# Patient Record
Sex: Female | Born: 1965 | Race: Black or African American | Hispanic: No | Smoking: Current every day smoker
Health system: Southern US, Community
[De-identification: ages and names within clinical notes are randomized; demographics above are authoritative.]

## PROBLEM LIST (undated history)

## (undated) DIAGNOSIS — E669 Obesity, unspecified: Secondary | ICD-10-CM

## (undated) DIAGNOSIS — I10 Essential (primary) hypertension: Secondary | ICD-10-CM

## (undated) DIAGNOSIS — M797 Fibromyalgia: Secondary | ICD-10-CM

## (undated) HISTORY — PX: UTERINE FIBROID SURGERY: SHX826

## (undated) HISTORY — PX: ABDOMINAL SURGERY: SHX537

---

## 1998-08-16 ENCOUNTER — Emergency Department (HOSPITAL_COMMUNITY): Admission: EM | Admit: 1998-08-16 | Discharge: 1998-08-16 | Payer: Self-pay | Admitting: Emergency Medicine

## 1999-04-29 ENCOUNTER — Emergency Department (HOSPITAL_COMMUNITY): Admission: EM | Admit: 1999-04-29 | Discharge: 1999-04-29 | Payer: Self-pay | Admitting: Emergency Medicine

## 1999-05-08 ENCOUNTER — Emergency Department (HOSPITAL_COMMUNITY): Admission: EM | Admit: 1999-05-08 | Discharge: 1999-05-08 | Payer: Self-pay | Admitting: Emergency Medicine

## 2000-06-03 ENCOUNTER — Emergency Department (HOSPITAL_COMMUNITY): Admission: EM | Admit: 2000-06-03 | Discharge: 2000-06-03 | Payer: Self-pay | Admitting: Emergency Medicine

## 2001-03-17 ENCOUNTER — Other Ambulatory Visit: Admission: RE | Admit: 2001-03-17 | Discharge: 2001-03-17 | Payer: Self-pay | Admitting: Ophthalmology

## 2003-01-07 ENCOUNTER — Emergency Department (HOSPITAL_COMMUNITY): Admission: EM | Admit: 2003-01-07 | Discharge: 2003-01-07 | Payer: Self-pay | Admitting: Emergency Medicine

## 2003-04-13 ENCOUNTER — Other Ambulatory Visit: Admission: RE | Admit: 2003-04-13 | Discharge: 2003-04-13 | Payer: Self-pay | Admitting: Family Medicine

## 2004-10-11 ENCOUNTER — Other Ambulatory Visit: Admission: RE | Admit: 2004-10-11 | Discharge: 2004-10-11 | Payer: Self-pay | Admitting: Family Medicine

## 2005-05-30 ENCOUNTER — Emergency Department (HOSPITAL_COMMUNITY): Admission: EM | Admit: 2005-05-30 | Discharge: 2005-05-30 | Payer: Self-pay | Admitting: Emergency Medicine

## 2006-01-02 ENCOUNTER — Emergency Department (HOSPITAL_COMMUNITY): Admission: EM | Admit: 2006-01-02 | Discharge: 2006-01-03 | Payer: Self-pay | Admitting: Emergency Medicine

## 2006-11-01 ENCOUNTER — Emergency Department (HOSPITAL_COMMUNITY): Admission: EM | Admit: 2006-11-01 | Discharge: 2006-11-01 | Payer: Self-pay | Admitting: Emergency Medicine

## 2006-12-24 ENCOUNTER — Emergency Department (HOSPITAL_COMMUNITY): Admission: EM | Admit: 2006-12-24 | Discharge: 2006-12-25 | Payer: Self-pay | Admitting: Emergency Medicine

## 2007-03-02 ENCOUNTER — Other Ambulatory Visit: Admission: RE | Admit: 2007-03-02 | Discharge: 2007-03-02 | Payer: Self-pay | Admitting: Family Medicine

## 2007-10-16 ENCOUNTER — Emergency Department (HOSPITAL_COMMUNITY): Admission: EM | Admit: 2007-10-16 | Discharge: 2007-10-16 | Payer: Self-pay | Admitting: Emergency Medicine

## 2008-04-19 ENCOUNTER — Other Ambulatory Visit: Admission: RE | Admit: 2008-04-19 | Discharge: 2008-04-19 | Payer: Self-pay | Admitting: Family Medicine

## 2008-07-09 ENCOUNTER — Emergency Department (HOSPITAL_COMMUNITY): Admission: EM | Admit: 2008-07-09 | Discharge: 2008-07-10 | Payer: Self-pay | Admitting: Emergency Medicine

## 2008-10-09 ENCOUNTER — Emergency Department (HOSPITAL_COMMUNITY): Admission: EM | Admit: 2008-10-09 | Discharge: 2008-10-09 | Payer: Self-pay | Admitting: Emergency Medicine

## 2009-01-25 ENCOUNTER — Ambulatory Visit: Payer: Self-pay | Admitting: Gynecology

## 2009-02-08 ENCOUNTER — Ambulatory Visit: Payer: Self-pay | Admitting: Gynecology

## 2010-04-26 LAB — GLUCOSE, CAPILLARY: Glucose-Capillary: 93 mg/dL (ref 70–99)

## 2010-04-29 LAB — POCT CARDIAC MARKERS: Myoglobin, poc: 38.4 ng/mL (ref 12–200)

## 2010-07-18 ENCOUNTER — Other Ambulatory Visit: Payer: Self-pay | Admitting: Obstetrics and Gynecology

## 2010-07-18 DIAGNOSIS — N9489 Other specified conditions associated with female genital organs and menstrual cycle: Secondary | ICD-10-CM

## 2010-07-26 ENCOUNTER — Emergency Department (HOSPITAL_COMMUNITY): Payer: BC Managed Care – PPO

## 2010-07-26 ENCOUNTER — Emergency Department (HOSPITAL_COMMUNITY)
Admission: EM | Admit: 2010-07-26 | Discharge: 2010-07-26 | Disposition: A | Discharge status: Home / Self Care | Payer: BC Managed Care – PPO | Attending: Emergency Medicine | Admitting: Emergency Medicine

## 2010-07-26 DIAGNOSIS — D259 Leiomyoma of uterus, unspecified: Secondary | ICD-10-CM | POA: Insufficient documentation

## 2010-07-26 DIAGNOSIS — N949 Unspecified condition associated with female genital organs and menstrual cycle: Secondary | ICD-10-CM | POA: Insufficient documentation

## 2010-07-26 DIAGNOSIS — R109 Unspecified abdominal pain: Secondary | ICD-10-CM | POA: Insufficient documentation

## 2010-07-26 LAB — COMPREHENSIVE METABOLIC PANEL
ALT: 14 U/L (ref 0–35)
BUN: 8 mg/dL (ref 6–23)
CO2: 25 mEq/L (ref 19–32)
Calcium: 9.3 mg/dL (ref 8.4–10.5)
Chloride: 105 mEq/L (ref 96–112)
Creatinine, Ser: 0.63 mg/dL (ref 0.50–1.10)
Potassium: 3.7 mEq/L (ref 3.5–5.1)
Sodium: 139 mEq/L (ref 135–145)
Total Bilirubin: 0.4 mg/dL (ref 0.3–1.2)

## 2010-07-26 LAB — URINALYSIS, ROUTINE W REFLEX MICROSCOPIC
Bilirubin Urine: NEGATIVE
Protein, ur: NEGATIVE mg/dL

## 2010-07-26 LAB — CBC
MCV: 76.8 fL — ABNORMAL LOW (ref 78.0–100.0)
Platelets: 267 10*3/uL (ref 150–400)
RBC: 5.04 MIL/uL (ref 3.87–5.11)
WBC: 8.3 10*3/uL (ref 4.0–10.5)

## 2010-07-26 LAB — DIFFERENTIAL
Basophils Relative: 0 % (ref 0–1)
Eosinophils Absolute: 0 10*3/uL (ref 0.0–0.7)
Lymphocytes Relative: 13 % (ref 12–46)
Monocytes Absolute: 0.6 10*3/uL (ref 0.1–1.0)
Monocytes Relative: 8 % (ref 3–12)
Neutro Abs: 6.5 10*3/uL (ref 1.7–7.7)
Neutrophils Relative %: 79 % — ABNORMAL HIGH (ref 43–77)

## 2010-07-27 ENCOUNTER — Ambulatory Visit
Admission: RE | Admit: 2010-07-27 | Discharge: 2010-07-27 | Disposition: A | Discharge status: Home / Self Care | Payer: BC Managed Care – PPO | Source: Ambulatory Visit | Attending: Obstetrics and Gynecology | Admitting: Obstetrics and Gynecology

## 2010-07-27 DIAGNOSIS — N9489 Other specified conditions associated with female genital organs and menstrual cycle: Secondary | ICD-10-CM

## 2010-07-27 MED ORDER — GADOBENATE DIMEGLUMINE 529 MG/ML IV SOLN
20.0000 mL | Freq: Once | INTRAVENOUS | Status: AC | PRN
  Administered 2010-07-27: 20 mL via INTRAVENOUS

## 2010-10-28 LAB — I-STAT 8, (EC8 V) (CONVERTED LAB)
BUN: 7
Bicarbonate: 27.3 — ABNORMAL HIGH
Chloride: 106
Glucose, Bld: 87
Hemoglobin: 13.9
Sodium: 141
TCO2: 29
pCO2, Ven: 48.2

## 2010-10-28 LAB — CBC
Hemoglobin: 11.5 — ABNORMAL LOW
Platelets: 399
RDW: 14.4

## 2010-10-28 LAB — DIFFERENTIAL
Basophils Relative: 0
Eosinophils Absolute: 0.4
Eosinophils Relative: 7 — ABNORMAL HIGH
Lymphocytes Relative: 26
Monocytes Relative: 10
Neutro Abs: 3.8

## 2010-10-28 LAB — POCT I-STAT CREATININE: Operator id: 284251

## 2011-06-01 ENCOUNTER — Encounter (HOSPITAL_COMMUNITY): Payer: Self-pay | Admitting: Emergency Medicine

## 2011-06-01 ENCOUNTER — Emergency Department (HOSPITAL_COMMUNITY)
Admission: EM | Admit: 2011-06-01 | Discharge: 2011-06-01 | Disposition: A | Discharge status: Home / Self Care | Payer: BC Managed Care – PPO | Attending: Emergency Medicine | Admitting: Emergency Medicine

## 2011-06-01 DIAGNOSIS — M549 Dorsalgia, unspecified: Secondary | ICD-10-CM

## 2011-06-01 DIAGNOSIS — F17200 Nicotine dependence, unspecified, uncomplicated: Secondary | ICD-10-CM | POA: Insufficient documentation

## 2011-06-01 DIAGNOSIS — M545 Low back pain, unspecified: Secondary | ICD-10-CM | POA: Insufficient documentation

## 2011-06-01 DIAGNOSIS — M62838 Other muscle spasm: Secondary | ICD-10-CM | POA: Insufficient documentation

## 2011-06-01 LAB — URINALYSIS, ROUTINE W REFLEX MICROSCOPIC
Bilirubin Urine: NEGATIVE
Glucose, UA: NEGATIVE mg/dL
Hgb urine dipstick: NEGATIVE
Specific Gravity, Urine: 1.022 (ref 1.005–1.030)
Urobilinogen, UA: 1 mg/dL (ref 0.0–1.0)
pH: 6.5 (ref 5.0–8.0)

## 2011-06-01 LAB — URINE MICROSCOPIC-ADD ON

## 2011-06-01 MED ORDER — DIAZEPAM 5 MG PO TABS: ORAL_TABLET | ORAL | Status: AC

## 2011-06-01 MED ORDER — IBUPROFEN 800 MG PO TABS: 800.0000 mg | ORAL_TABLET | Freq: Three times a day (TID) | ORAL | Status: AC

## 2011-06-01 MED ORDER — ACETAMINOPHEN-CODEINE #3 300-30 MG PO TABS: 1.0000 | ORAL_TABLET | Freq: Four times a day (QID) | ORAL | Status: AC | PRN

## 2011-06-01 MED ORDER — IBUPROFEN 800 MG PO TABS
800.0000 mg | ORAL_TABLET | Freq: Once | ORAL | Status: AC
  Administered 2011-06-01: 800 mg via ORAL
  Filled 2011-06-01: qty 1

## 2011-06-01 NOTE — ED Notes (Signed)
MD at bedside. 

## 2011-06-01 NOTE — ED Provider Notes (Signed)
Medical screening examination/treatment/procedure(s) were performed by non-physician practitioner and as supervising physician I was immediately available for consultation/collaboration. Devoria Albe, MD, Armando Gang   Ward Givens, MD 06/01/11 2131

## 2011-06-01 NOTE — ED Notes (Signed)
Pt alert, nad, arrives from home, c/o low back, denies trauma or injury, ambulates to triage, steady gait, noted, describes pain as sharp, non radiating, resp even unlabored, denies changes in bowel or bladder

## 2011-06-01 NOTE — Discharge Instructions (Signed)
Take ibuprofen as directed for inflammation and pain with tylenol #3 for breakthrough pain and valium for muscle relaxation but do not drive or operate machinery with tylenol#3 or valium use. Ice to areas of soreness for the next few days and then may move to heat. Expect to be sore for the next few day and follow up with primary care physician for recheck of ongoing symptoms but return to ER for emergent changing or worsening of symptoms.    Back Pain, Adult Back pain is very common. The pain often gets better over time. The cause of back pain is usually not dangerous. Most people can learn to manage their back pain on their own.  HOME CARE   Stay active. Start with short walks on flat ground if you can. Try to walk farther each day.   Do not sit, drive, or stand in one place for more than 30 minutes. Do not stay in bed.   Do not avoid exercise or work. Activity can help your back heal faster.   Be careful when you bend or lift an object. Bend at your knees, keep the object close to you, and do not twist.   Sleep on a firm mattress. Lie on your side, and bend your knees. If you lie on your back, put a pillow under your knees.   Only take medicines as told by your doctor.   Put ice on the injured area.   Put ice in a plastic bag.   Place a towel between your skin and the bag.   Leave the ice on for 15 to 20 minutes, 3 to 4 times a day for the first 2 to 3 days. After that, you can switch between ice and heat packs.   Ask your doctor about back exercises or massage.   Avoid feeling anxious or stressed. Find good ways to deal with stress, such as exercise.  GET HELP RIGHT AWAY IF:   Your pain does not go away with rest or medicine.   Your pain does not go away in 1 week.   You have new problems.   You do not feel well.   The pain spreads into your legs.   You cannot control when you poop (bowel movement) or pee (urinate).   Your arms or legs feel weak or lose feeling  (numbness).   You feel sick to your stomach (nauseous) or throw up (vomit).   You have belly (abdominal) pain.   You feel like you may pass out (faint).  MAKE SURE YOU:   Understand these instructions.   Will watch your condition.   Will get help right away if you are not doing well or get worse.  Document Released: 06/25/2007 Document Revised: 12/26/2010 Document Reviewed: 05/27/2010 [facility] Patient Information 2012 [place], [place].

## 2011-06-01 NOTE — ED Provider Notes (Signed)
History     CSN: [id number]  Arrival date & time 06/01/11  1953   First MD Initiated Contact with Patient 06/01/11 2013      Chief Complaint  Patient presents with  . Back Pain    (Consider location/radiation/quality/duration/timing/severity/associated sxs/prior treatment) HPI  Patient presents to ER complaining of gradual onset lower back pain that she states is a dull constant ache with intermittent sharp shooting pain stating pain radiates from lower back around toward lower abdomen. She states pain began last night and has persisted throughout the day today. Denies known injury to back. States "sometimes if I move around it makes it grab and get worse and sometimes it stabs even if I am sitting still." Patient denies fevers, chills, CP, SOB, abdominal pain, n/v/d, dysuria, hematuria or blood in stool. Denies difficulty ambulating. Patient states she has no known medical problems and takes no meds on regular basis.   History reviewed. No pertinent past medical history.  Past Surgical History  Procedure Date  . Abdominal surgery   . Cesarean section     No family history on file.  History  Substance Use Topics  . Smoking status: Current Everyday Smoker -- 1.0 packs/day    Types: Cigarettes  . Smokeless tobacco: Not on file  . Alcohol Use: No    OB History    Grav Para Term Preterm Abortions TAB SAB Ect Mult Living                  Review of Systems  All other systems reviewed and are negative.    Allergies  Review of patient's allergies indicates no known allergies.  Home Medications   Current Outpatient Rx  Name Route Sig Dispense Refill  . ADULT MULTIVITAMIN W/MINERALS CH Oral Take 1 tablet by mouth daily.      BP 153/85  Pulse 77  Temp(Src) 98 F (36.7 C) (Oral)  Resp 16  Wt 220 lb (99.791 kg)  SpO2 100%  LMP 05/02/2011  Physical Exam  Nursing note and vitals reviewed. Constitutional: She is oriented to person, place, and time. She appears  well-developed and well-nourished. No distress.  HENT:  Head: Normocephalic and atraumatic.  Eyes: Conjunctivae are normal.  Neck: Normal range of motion. Neck supple.  Cardiovascular: Normal rate, regular rhythm, normal heart sounds and intact distal pulses.  Exam reveals no gallop and no friction rub.   No murmur heard. Pulmonary/Chest: Effort normal and breath sounds normal. No respiratory distress. She has no wheezes. She has no rales. She exhibits no tenderness.  Abdominal: Soft. Bowel sounds are normal. She exhibits no distension and no mass. There is no tenderness. There is no rebound and no guarding.  Musculoskeletal: Normal range of motion. She exhibits tenderness. She exhibits no edema.       Mild TTP of lower back without skin changes or crepitous. No rash. No crepitous.   FROM of bilateral LE with 5/5 strength. Normal reflexes.   Neurological: She is alert and oriented to person, place, and time.  Skin: Skin is warm and dry. No rash noted. She is not diaphoretic. No erythema.  Psychiatric: She has a normal mood and affect.    ED Course  Procedures (including critical care time)  PO ibuprofen   Labs Reviewed  URINALYSIS, ROUTINE W REFLEX MICROSCOPIC - Abnormal; Notable for the following:    APPearance CLOUDY (*)    Ketones, ur TRACE (*)    Protein, ur 30 (*)    All other components  within normal limits  URINE MICROSCOPIC-ADD ON - Abnormal; Notable for the following:    Squamous Epithelial / LPF FEW (*)    Bacteria, UA FEW (*)    All other components within normal limits   No results found.   1. Back pain   2. Muscle spasm       MDM  No red flags for lower back pain with no signs or symptoms of cauda equina or central cord compression. Abdomen soft and non tender. Non acute abdomen with no signs or UTI. Ambulating without difficulty. Intermittent sharp pain question consistent with spasm with TTP of lower back.         Jenness [place], [place] 06/01/11 2128

## 2011-10-28 ENCOUNTER — Encounter (HOSPITAL_COMMUNITY): Payer: Self-pay | Admitting: *Deleted

## 2011-10-28 ENCOUNTER — Emergency Department (HOSPITAL_COMMUNITY)
Admission: EM | Admit: 2011-10-28 | Discharge: 2011-10-28 | Disposition: A | Discharge status: Home / Self Care | Payer: BC Managed Care – PPO | Attending: Emergency Medicine | Admitting: Emergency Medicine

## 2011-10-28 DIAGNOSIS — R05 Cough: Secondary | ICD-10-CM

## 2011-10-28 DIAGNOSIS — R059 Cough, unspecified: Secondary | ICD-10-CM | POA: Insufficient documentation

## 2011-10-28 DIAGNOSIS — F17200 Nicotine dependence, unspecified, uncomplicated: Secondary | ICD-10-CM | POA: Insufficient documentation

## 2011-10-28 MED ORDER — AEROCHAMBER Z-STAT PLUS/MEDIUM MISC
1.0000 | Freq: Once | Status: AC
  Administered 2011-10-28: 1

## 2011-10-28 MED ORDER — HYDROCOD POLST-CHLORPHEN POLST 10-8 MG/5ML PO LQCR: 5.0000 mL | Freq: Two times a day (BID) | ORAL | Status: DC | PRN

## 2011-10-28 MED ORDER — ALBUTEROL SULFATE HFA 108 (90 BASE) MCG/ACT IN AERS
2.0000 | INHALATION_SPRAY | RESPIRATORY_TRACT | Status: DC | PRN
  Administered 2011-10-28: 2 via RESPIRATORY_TRACT
  Filled 2011-10-28: qty 6.7

## 2011-10-28 NOTE — ED Notes (Signed)
Pt reports clear white productive cough x1 month. Went to pcp, was treated abx, pt reports still having cough, denies sore throat or fever. Reports when she starts coughing really bad she starts to vomit. Pt reports shoulders and back sore 8/10.

## 2011-10-28 NOTE — ED Provider Notes (Signed)
History     CSN: [id number]  Arrival date & time 10/28/11  8756   First MD Initiated Contact with Patient 10/28/11 1958      Chief Complaint  Patient presents with  . Cough    (Consider location/radiation/quality/duration/timing/severity/associated sxs/prior treatment) HPI Comments: Patient with cough for the past one month. She has "spells" of coughing. Patient has had some posttussive emesis. Cough is productive of clear and white sputum. She has not been having fever. Patient states she's had some mild nasal congestion. She denies sore throat or ear pain. Patient states that the muscles of her back are sore because of the coughing. No other nausea, abdominal pain, or bowel changes. Patient states that she stopped smoking 2 months ago. Patient was treated at the onset with oral antibiotics which did not help. Onset is acute. Course is constant. Nothing makes symptoms better or worse. She is on lisinopril.  The history is provided by the patient.    History reviewed. No pertinent past medical history.  Past Surgical History  Procedure Date  . Abdominal surgery   . Cesarean section     History reviewed. No pertinent family history.  History  Substance Use Topics  . Smoking status: Former Smoker -- 1.0 packs/day    Types: Cigarettes  . Smokeless tobacco: Not on file  . Alcohol Use: No    OB History    Grav Para Term Preterm Abortions TAB SAB Ect Mult Living                  Review of Systems  Constitutional: Negative for fever, chills and fatigue.  HENT: Positive for congestion, rhinorrhea and postnasal drip. Negative for ear pain, sore throat, neck stiffness and sinus pressure.   Eyes: Negative for redness.  Respiratory: Positive for cough. Negative for shortness of breath and wheezing.   Cardiovascular: Negative for leg swelling.  Gastrointestinal: Negative for nausea, vomiting, abdominal pain and diarrhea.  Musculoskeletal: Positive for myalgias.  Skin: Negative  for rash.  Hematological: Negative for adenopathy.    Allergies  Review of patient's allergies indicates no known allergies.  Home Medications   Current Outpatient Rx  Name Route Sig Dispense Refill  . LISINOPRIL-HYDROCHLOROTHIAZIDE 20-25 MG PO TABS Oral Take 1 tablet by mouth daily.    Marland Kitchen OVER THE COUNTER MEDICATION Oral Take 10 mLs by mouth every 8 (eight) hours as needed. OTC Cough syrup.      BP 138/89  Pulse 77  Temp 98.4 F (36.9 C)  Resp 16  SpO2 96%  Physical Exam  Nursing note and vitals reviewed. Constitutional: She appears well-developed and well-nourished.       Coughing during exam.  HENT:  Head: Normocephalic and atraumatic.  Right Ear: Tympanic membrane, external ear and ear canal normal.  Left Ear: Tympanic membrane, external ear and ear canal normal.  Nose: Nose normal. No mucosal edema or rhinorrhea.  Mouth/Throat: Uvula is midline, oropharynx is clear and moist and mucous membranes are normal.  Eyes: Conjunctivae normal are normal. Pupils are equal, round, and reactive to light. Right eye exhibits no discharge. Left eye exhibits no discharge.  Neck: Normal range of motion. Neck supple.  Cardiovascular: Normal rate, regular rhythm and normal heart sounds.   Pulmonary/Chest: Effort normal and breath sounds normal. No respiratory distress. She has no wheezes. She has no rales.  Abdominal: Soft. There is no tenderness.  Neurological: She is alert.  Skin: Skin is warm and dry.  Psychiatric: She has a normal mood  and affect.    ED Course  Procedures (including critical care time)  Labs Reviewed - No data to display No results found.   1. Cough     8:48 PM Patient seen and examined.   Vital signs reviewed and are as follows: Filed Vitals:   10/28/11 1817  BP: 138/89  Pulse: 77  Temp: 98.4 F (36.9 C)  Resp: 16   Patient counseled on use of albuterol HFA.  Told to use 1-2 puffs q 4 hours as needed for SOB.  Patient counseled on use of  narcotic cough medications. Counseled not to combine these medications with others containing tylenol. Urged not to drink alcohol, drive, or perform any other activities that requires focus while taking these medications. The patient verbalizes understanding and agrees with the plan.   MDM  Cough for a month. This may be because of recent discontinuation of smoking. Will treat empirically with cough syrup and albuterol. Do not suspect pneumonia as patient has been afebrile. Also consider URI, postnasal drip. Urged patient to follow up with her primary care physician.        Renne Crigler, [place] 10/28/11 2102

## 2011-10-28 NOTE — ED Provider Notes (Signed)
Medical screening examination/treatment/procedure(s) were performed by non-physician practitioner and as supervising physician I was immediately available for consultation/collaboration.   Merrily Tegeler Y. Juleah Paradise, MD 10/28/11 2306 

## 2012-05-04 ENCOUNTER — Emergency Department (HOSPITAL_COMMUNITY): Payer: BC Managed Care – PPO

## 2012-05-04 ENCOUNTER — Emergency Department (HOSPITAL_COMMUNITY)
Admission: EM | Admit: 2012-05-04 | Discharge: 2012-05-04 | Disposition: A | Discharge status: Home / Self Care | Payer: BC Managed Care – PPO | Attending: Emergency Medicine | Admitting: Emergency Medicine

## 2012-05-04 ENCOUNTER — Encounter (HOSPITAL_COMMUNITY): Payer: Self-pay | Admitting: Emergency Medicine

## 2012-05-04 DIAGNOSIS — Z79899 Other long term (current) drug therapy: Secondary | ICD-10-CM | POA: Insufficient documentation

## 2012-05-04 DIAGNOSIS — Z87891 Personal history of nicotine dependence: Secondary | ICD-10-CM | POA: Insufficient documentation

## 2012-05-04 DIAGNOSIS — E669 Obesity, unspecified: Secondary | ICD-10-CM | POA: Insufficient documentation

## 2012-05-04 DIAGNOSIS — R0789 Other chest pain: Secondary | ICD-10-CM

## 2012-05-04 DIAGNOSIS — I10 Essential (primary) hypertension: Secondary | ICD-10-CM | POA: Insufficient documentation

## 2012-05-04 HISTORY — DX: Essential (primary) hypertension: I10

## 2012-05-04 LAB — CBC
HCT: 38.7 % (ref 36.0–46.0)
Hemoglobin: 12.6 g/dL (ref 12.0–15.0)
MCH: 25.3 pg — ABNORMAL LOW (ref 26.0–34.0)
MCHC: 32.6 g/dL (ref 30.0–36.0)
MCV: 77.7 fL — ABNORMAL LOW (ref 78.0–100.0)
Platelets: 340 10*3/uL (ref 150–400)
RBC: 4.98 MIL/uL (ref 3.87–5.11)
RDW: 15.2 % (ref 11.5–15.5)
WBC: 5.6 10*3/uL (ref 4.0–10.5)

## 2012-05-04 LAB — BASIC METABOLIC PANEL
BUN: 11 mg/dL (ref 6–23)
CO2: 26 mEq/L (ref 19–32)
Calcium: 8.9 mg/dL (ref 8.4–10.5)
Chloride: 104 mEq/L (ref 96–112)
Creatinine, Ser: 0.7 mg/dL (ref 0.50–1.10)
GFR calc Af Amer: >90 mL/min (ref >90)
GFR calc non Af Amer: >90 mL/min (ref >90)
Glucose, Bld: 82 mg/dL (ref 70–99)
Potassium: 3.6 mEq/L (ref 3.5–5.1)
Sodium: 140 mEq/L (ref 135–145)

## 2012-05-04 LAB — POCT I-STAT TROPONIN I: Troponin i, poc: 0.01 ng/mL (ref 0.00–0.08)

## 2012-05-04 NOTE — ED Notes (Signed)
Pt c/o 5/10 left sided chest pain that started at 0500 this morning. The pt states she has had chest pain in the past, however it has been years since the last occurrence.  Pt states she has had back pain, headaches, neck pain, and bilateral arm pain. Pt denies shortness of breath, nausea, or dizziness.

## 2012-05-06 NOTE — ED Provider Notes (Signed)
History    47yf with CP. Triage note reviewed, but disagree with stated HPI. Pt telling me she has been actually been having pain for years but came to ED after encouragement from friends. Pain in center/L anterior chest. Sharp. Lasts seconds to minutes. Has been having intermittently for at least the past 2 years. No appreciable exacerbating or relieving factors. Most recent pain today around 0500 and similar to previous episodes. Currently no complaints. No n/v, sob, palpitations or diaphoresis. No fever, chills or chough. No unusual leg pain or swelling. No know CAD but has HTN and use to smoke. No previous cath and doesn't think has had stress test.   CSN: [id number]  Arrival date & time 05/04/12  1324   First MD Initiated Contact with Patient 05/04/12 1347      Chief Complaint  Patient presents with  . Chest Pain    (Consider location/radiation/quality/duration/timing/severity/associated sxs/prior treatment) HPI  Past Medical History  Diagnosis Date  . Hypertension     Past Surgical History  Procedure Laterality Date  . Abdominal surgery    . Cesarean section      No family history on file.  History  Substance Use Topics  . Smoking status: Former Smoker -- 1.00 packs/day for 10 years    Types: Cigarettes  . Smokeless tobacco: Never Used  . Alcohol Use: No    OB History   Grav Para Term Preterm Abortions TAB SAB Ect Mult Living                  Review of Systems  All systems reviewed and negative, other than as noted in HPI.   Allergies  Review of patient's allergies indicates no known allergies.  Home Medications   Current Outpatient Rx  Name  Route  Sig  Dispense  Refill  . lisinopril-hydrochlorothiazide (PRINZIDE,ZESTORETIC) 20-25 MG per tablet   Oral   Take 1 tablet by mouth daily.           BP 160/83  Pulse 75  Temp(Src) 98 F (36.7 C) (Oral)  Resp 20  SpO2 100%  Physical Exam  Nursing note and vitals reviewed. Constitutional: She  appears well-developed and well-nourished. No distress.  Laying in bed. NAD. Obese.   HENT:  Head: Normocephalic and atraumatic.  Eyes: Conjunctivae are normal. Right eye exhibits no discharge. Left eye exhibits no discharge.  Neck: Neck supple.  Cardiovascular: Normal rate, regular rhythm and normal heart sounds.  Exam reveals no gallop and no friction rub.   No murmur heard. Pulmonary/Chest: Effort normal and breath sounds normal. No respiratory distress.  Abdominal: Soft. She exhibits no distension. There is no tenderness.  Musculoskeletal: She exhibits no edema and no tenderness.  Lower extremities symmetric as compared to each other. No calf tenderness. Negative Homan's. No palpable cords.   Neurological: She is alert.  Skin: Skin is warm and dry. She is not diaphoretic.  Psychiatric: She has a normal mood and affect. Her behavior is normal. Thought content normal.    ED Course  Procedures (including critical care time)  Labs Reviewed  CBC - Abnormal; Notable for the following:    MCV 77.7 (*)    MCH 25.3 (*)    All other components within normal limits  BASIC METABOLIC PANEL  POCT I-STAT TROPONIN I   Dg Chest 2 View  05/04/2012  *RADIOLOGY REPORT*  Clinical Data: Smoker with mid chest pain.  Current history of hypertension.  CHEST - 2 VIEW  Comparison: Two-view chest x-ray  07/09/2008.  Findings: Cardiac silhouette upper normal in size, unchanged. Hilar and mediastinal contours otherwise unremarkable.  Prominent bronchovascular markings diffusely and moderate central peribronchial thickening, more so than on the prior examination. Lungs otherwise clear.  Pulmonary vascularity normal.  No pleural effusions.  No pneumothorax.  Mild degenerative changes involving the lumbar spine.  IMPRESSION: Moderate changes of acute bronchitis and/or asthma without localized airspace pneumonia.   Original Report Authenticated By: Hulan Saas, M.D.    EKG:  Rhythm: normalsinus Vent. rate 81  BPM PR interval 180 ms QRS duration 92 ms QT/QTc 380/441 ms ST segments: normal Comparison: none   1. Atypical chest pain       MDM  47yF with CP. Some rik factors for CAD including HTN and smoking hx. Symptoms atypical for ACS with such a long duration and little/no change in character. EKG w/o concerning changes. Labs unremarkable. CXR clear.  I feel pt low enough risk for DC at this time but discussed the need for stress testing. Emergent return precautions discussed.         Raeford Razor, MD 05/06/12 [phone]

## 2012-09-11 ENCOUNTER — Emergency Department (HOSPITAL_COMMUNITY)
Admission: EM | Admit: 2012-09-11 | Discharge: 2012-09-11 | Disposition: A | Discharge status: Home / Self Care | Payer: BC Managed Care – PPO | Attending: Emergency Medicine | Admitting: Emergency Medicine

## 2012-09-11 ENCOUNTER — Encounter (HOSPITAL_COMMUNITY): Payer: Self-pay | Admitting: *Deleted

## 2012-09-11 DIAGNOSIS — H00023 Hordeolum internum right eye, unspecified eyelid: Secondary | ICD-10-CM

## 2012-09-11 DIAGNOSIS — H5789 Other specified disorders of eye and adnexa: Secondary | ICD-10-CM | POA: Insufficient documentation

## 2012-09-11 DIAGNOSIS — H5710 Ocular pain, unspecified eye: Secondary | ICD-10-CM | POA: Insufficient documentation

## 2012-09-11 DIAGNOSIS — Z79899 Other long term (current) drug therapy: Secondary | ICD-10-CM | POA: Insufficient documentation

## 2012-09-11 DIAGNOSIS — H00029 Hordeolum internum unspecified eye, unspecified eyelid: Secondary | ICD-10-CM | POA: Insufficient documentation

## 2012-09-11 DIAGNOSIS — F17200 Nicotine dependence, unspecified, uncomplicated: Secondary | ICD-10-CM | POA: Insufficient documentation

## 2012-09-11 DIAGNOSIS — I10 Essential (primary) hypertension: Secondary | ICD-10-CM | POA: Insufficient documentation

## 2012-09-11 DIAGNOSIS — H579 Unspecified disorder of eye and adnexa: Secondary | ICD-10-CM | POA: Insufficient documentation

## 2012-09-11 MED ORDER — ERYTHROMYCIN 5 MG/GM OP OINT
TOPICAL_OINTMENT | Freq: Once | OPHTHALMIC | Status: AC
  Administered 2012-09-11: 1 via OPHTHALMIC
  Filled 2012-09-11: qty 3.5

## 2012-09-11 MED ORDER — ERYTHROMYCIN 5 MG/GM OP OINT: TOPICAL_OINTMENT | Freq: Four times a day (QID) | OPHTHALMIC | Status: AC

## 2012-09-11 NOTE — ED Notes (Signed)
Pt ambulating independently w/ steady gait on d/c in no acute distress, A&Ox4. D/c instructions reviewed w/ pt and family - pt and family deny any further questions or concerns at present. Rx given x1  

## 2012-09-11 NOTE — ED Provider Notes (Signed)
Medical screening examination/treatment/procedure(s) were performed by non-physician practitioner and as supervising physician I was immediately available for consultation/collaboration.   Enid Skeens, MD 09/11/12 [phone]

## 2012-09-11 NOTE — ED Notes (Signed)
Pt reports rt eye pain x2 days ago, awoke yesterday a.m. W/ rt upper lid swelling and white drainage.

## 2012-09-11 NOTE — ED Provider Notes (Signed)
  CSN: [id number]     Arrival date & time 09/11/12  0005 History     First MD Initiated Contact with Patient 09/11/12 0022     Chief Complaint  Patient presents with  . Facial Swelling  . Eye Drainage   (Consider location/radiation/quality/duration/timing/severity/associated sxs/prior Treatment) HPI Comments: R eye pain swelling and drainage for the past 2 days  Has not taken/tried and therapy.  The history is provided by the patient.    Past Medical History  Diagnosis Date  . Hypertension    Past Surgical History  Procedure Laterality Date  . Abdominal surgery    . Cesarean section     History reviewed. No pertinent family history. History  Substance Use Topics  . Smoking status: Current Every Day Smoker -- 0.50 packs/day for 10 years    Types: Cigarettes  . Smokeless tobacco: Never Used  . Alcohol Use: No   OB History   Grav Para Term Preterm Abortions TAB SAB Ect Mult Living                 Review of Systems  Constitutional: Negative for fever and chills.  HENT: Negative for ear pain, congestion and rhinorrhea.   Eyes: Positive for pain, discharge and itching. Negative for photophobia, redness and visual disturbance.  Neurological: Negative for dizziness and headaches.  All other systems reviewed and are negative.    Allergies  Review of patient's allergies indicates no known allergies.  Home Medications   Current Outpatient Rx  Name  Route  Sig  Dispense  Refill  . erythromycin ophthalmic ointment   Right Eye   Place into the right eye every 6 (six) hours.   3.5 g   0   . lisinopril-hydrochlorothiazide (PRINZIDE,ZESTORETIC) 20-25 MG per tablet   Oral   Take 1 tablet by mouth daily.          BP 157/96  Pulse 77  Temp(Src) 98.3 F (36.8 C) (Oral)  Resp 15  SpO2 98% Physical Exam  Nursing note and vitals reviewed. Constitutional: She appears well-nourished.  HENT:  Head: Normocephalic.  Eyes: EOM are normal. Pupils are equal, round, and  reactive to light. Right eye exhibits discharge. Right eye exhibits no chemosis, no exudate and no hordeolum. Right conjunctiva is not injected. Right conjunctiva has no hemorrhage.  Sty along the internal edge of R upper lateral lid  Neck: Normal range of motion.  Cardiovascular: Normal rate.   Musculoskeletal: Normal range of motion.  Neurological: She is alert.  Skin: Skin is warm and dry.    ED Course   Procedures (including critical care time)  Labs Reviewed - No data to display No results found. 1. Sty, internal, right     MDM   will treat with EES ointment   Arman Filter, NP 09/11/12 [phone]

## 2012-11-15 ENCOUNTER — Emergency Department (HOSPITAL_COMMUNITY): Payer: BC Managed Care – PPO

## 2012-11-15 ENCOUNTER — Encounter (HOSPITAL_COMMUNITY): Payer: Self-pay | Admitting: Emergency Medicine

## 2012-11-15 ENCOUNTER — Emergency Department (HOSPITAL_COMMUNITY)
Admission: EM | Admit: 2012-11-15 | Discharge: 2012-11-15 | Disposition: A | Discharge status: Home / Self Care | Payer: BC Managed Care – PPO | Attending: Emergency Medicine | Admitting: Emergency Medicine

## 2012-11-15 DIAGNOSIS — F17200 Nicotine dependence, unspecified, uncomplicated: Secondary | ICD-10-CM | POA: Insufficient documentation

## 2012-11-15 DIAGNOSIS — I10 Essential (primary) hypertension: Secondary | ICD-10-CM | POA: Insufficient documentation

## 2012-11-15 DIAGNOSIS — M25472 Effusion, left ankle: Secondary | ICD-10-CM

## 2012-11-15 DIAGNOSIS — M7989 Other specified soft tissue disorders: Secondary | ICD-10-CM | POA: Insufficient documentation

## 2012-11-15 DIAGNOSIS — Z79899 Other long term (current) drug therapy: Secondary | ICD-10-CM | POA: Insufficient documentation

## 2012-11-15 DIAGNOSIS — Z3202 Encounter for pregnancy test, result negative: Secondary | ICD-10-CM | POA: Insufficient documentation

## 2012-11-15 DIAGNOSIS — R29898 Other symptoms and signs involving the musculoskeletal system: Secondary | ICD-10-CM | POA: Diagnosis present

## 2012-11-15 LAB — URINALYSIS, ROUTINE W REFLEX MICROSCOPIC
Bilirubin Urine: NEGATIVE
Ketones, ur: NEGATIVE mg/dL
Nitrite: NEGATIVE
Specific Gravity, Urine: 1.013 (ref 1.005–1.030)
Urobilinogen, UA: 0.2 mg/dL (ref 0.0–1.0)
pH: 6.5 (ref 5.0–8.0)

## 2012-11-15 LAB — RAPID URINE DRUG SCREEN, HOSP PERFORMED
Barbiturates: NOT DETECTED
Cocaine: NOT DETECTED

## 2012-11-15 LAB — CBC
HCT: 39 % (ref 36.0–46.0)
Hemoglobin: 12.7 g/dL (ref 12.0–15.0)
MCH: 25.9 pg — ABNORMAL LOW (ref 26.0–34.0)
RBC: 4.9 MIL/uL (ref 3.87–5.11)

## 2012-11-15 LAB — COMPREHENSIVE METABOLIC PANEL
ALT: 13 U/L (ref 0–35)
Alkaline Phosphatase: 114 U/L (ref 39–117)
BUN: 7 mg/dL (ref 6–23)
CO2: 27 mEq/L (ref 19–32)
GFR calc Af Amer: >90 mL/min (ref >90)
GFR calc non Af Amer: >90 mL/min (ref >90)
Glucose, Bld: 88 mg/dL (ref 70–99)
Potassium: 3.9 mEq/L (ref 3.5–5.1)
Sodium: 136 mEq/L (ref 135–145)
Total Bilirubin: 0.2 mg/dL — ABNORMAL LOW (ref 0.3–1.2)

## 2012-11-15 LAB — URINE MICROSCOPIC-ADD ON

## 2012-11-15 LAB — TROPONIN I: Troponin I: <0.3 ng/mL (ref <0.30)

## 2012-11-15 LAB — PREGNANCY, URINE: Preg Test, Ur: NEGATIVE

## 2012-11-15 NOTE — ED Provider Notes (Signed)
CSN: [id number]     Arrival date & time 11/15/12  1113 History   First MD Initiated Contact with Patient 11/15/12 1143     No chief complaint on file.  (Consider location/radiation/quality/duration/timing/severity/associated sxs/prior Treatment) Patient is a 47 y.o. female presenting with neurologic complaint. The history is provided by the patient.  Neurologic Problem This is a new problem. The current episode started yesterday. Episode frequency: once. The problem has been resolved. Pertinent negatives include no chest pain, no abdominal pain, no headaches and no shortness of breath. Nothing aggravates the symptoms. Nothing relieves the symptoms. She has tried nothing for the symptoms. The treatment provided no relief.    Past Medical History  Diagnosis Date  . Hypertension    Past Surgical History  Procedure Laterality Date  . Abdominal surgery    . Cesarean section     History reviewed. No pertinent family history. History  Substance Use Topics  . Smoking status: Current Every Day Smoker -- 0.50 packs/day for 10 years    Types: Cigarettes  . Smokeless tobacco: Never Used  . Alcohol Use: No   OB History   Grav Para Term Preterm Abortions TAB SAB Ect Mult Living                 Review of Systems  Constitutional: Negative for fever and fatigue.  HENT: Negative for congestion and drooling.   Eyes: Negative for pain.  Respiratory: Negative for cough and shortness of breath.   Cardiovascular: Negative for chest pain.  Gastrointestinal: Negative for nausea, vomiting, abdominal pain and diarrhea.  Genitourinary: Negative for dysuria and hematuria.  Musculoskeletal: Negative for back pain, gait problem and neck pain.  Skin: Negative for color change.  Neurological: Negative for dizziness and headaches.  Hematological: Negative for adenopathy.  Psychiatric/Behavioral: Negative for behavioral problems.  All other systems reviewed and are negative.    Allergies  Review of  patient's allergies indicates no known allergies.  Home Medications   Current Outpatient Rx  Name  Route  Sig  Dispense  Refill  . lisinopril-hydrochlorothiazide (PRINZIDE,ZESTORETIC) 20-25 MG per tablet   Oral   Take 1 tablet by mouth daily.          BP 141/72  Pulse 76  Temp(Src) 98.2 F (36.8 C) (Oral)  Resp 18  SpO2 99% Physical Exam  Nursing note and vitals reviewed. Constitutional: She is oriented to person, place, and time. She appears well-developed and well-nourished.  HENT:  Head: Normocephalic.  Mouth/Throat: Oropharynx is clear and moist. No oropharyngeal exudate.  Eyes: Conjunctivae and EOM are normal. Pupils are equal, round, and reactive to light.  Neck: Normal range of motion. Neck supple.  Cardiovascular: Normal rate, regular rhythm, normal heart sounds and intact distal pulses.  Exam reveals no gallop and no friction rub.   No murmur heard. Pulmonary/Chest: Effort normal and breath sounds normal. No respiratory distress. She has no wheezes.  Abdominal: Soft. Bowel sounds are normal. There is no tenderness. There is no rebound and no guarding.  Musculoskeletal: Normal range of motion. She exhibits no edema and no tenderness.  Mild non-pitting edema localized to left ankle.   Neurological: She is alert and oriented to person, place, and time. She has normal strength. No cranial nerve deficit or sensory deficit. She displays a negative Romberg sign. Coordination and gait normal.  Normal finger to nose testing bilaterally. The patient is ambulatory forwards and backwards without any difficulty. Normal understanding and normal speech on my exam. No obvious peripheral  field deficits found on exam.  Skin: Skin is warm and dry.  Psychiatric: She has a normal mood and affect. Her behavior is normal.    ED Course  Procedures (including critical care time) Labs Review Labs Reviewed  CBC - Abnormal; Notable for the following:    MCH 25.9 (*)    All other components  within normal limits  COMPREHENSIVE METABOLIC PANEL - Abnormal; Notable for the following:    Total Bilirubin 0.2 (*)    All other components within normal limits  URINALYSIS, ROUTINE W REFLEX MICROSCOPIC - Abnormal; Notable for the following:    Leukocytes, UA SMALL (*)    All other components within normal limits  URINE MICROSCOPIC-ADD ON - Abnormal; Notable for the following:    Squamous Epithelial / LPF FEW (*)    Bacteria, UA FEW (*)    All other components within normal limits  URINE CULTURE  URINE RAPID DRUG SCREEN (HOSP PERFORMED)  PREGNANCY, URINE  TROPONIN I   Imaging Review Mr Brain Wo Contrast  11/15/2012   CLINICAL DATA:  Sudden onset of bilateral ankle swelling. Left arm heaviness.  EXAM: MRI HEAD WITHOUT CONTRAST  TECHNIQUE: Multiplanar, multisequence MR imaging was performed. No intravenous contrast was administered.  COMPARISON:  None.  FINDINGS: No evidence for acute infarction, hemorrhage, mass lesion, hydrocephalus, or extra-axial fluid. No atrophy or white matter disease. No osseous findings. Flow voids are maintained. Moderate nasopharyngeal adenoidal hypertrophy without mastoid effusions. No sinus or mastoid disease.  IMPRESSION: No acute intracranial abnormality. Nonspecific nasopharyngeal adenoidal hypertrophy. Correlate clinically.   Electronically Signed   By: Davonna Belling M.D.   On: 11/15/2012 13:19    EKG Interpretation     Ventricular Rate:  64 PR Interval:  194 QRS Duration: 90 QT Interval:  421 QTC Calculation: 434 R Axis:   48 Text Interpretation:  Sinus rhythm No significant change was found            MDM   1. Left ankle swelling   2. Arm heaviness   3. Leg heaviness    12:11 PM 46 y.o. female w hx of HTN who presents with gradual onset left-sided heaviness in her left upper extremity and left lower extremity which began while at rest yesterday afternoon. She states her sx lasted approx 10 min. She also notes a tight sensation in her  face bilaterally which began today. She also notes that she has had some mild swelling in her left ankle. She is afebrile and vital signs are unremarkable here. She notes some continued very mild swelling in her left ankle but denies any weakness, numbness, dizziness, or headache on exam. She is neurologically intact and ambulatory on my exam. I think it would be reasonable to get an MRI to evaluate for TIA/CVA.   2:37 PM: Pt remains asx. I interpreted/reviewed the labs and/or imaging which were non-contributory. Will recommend close f/u w/ pcp for outpt workup for this possible TIA. I have discussed the diagnosis/risks/treatment options with the patient and believe the pt to be eligible for discharge home to follow-up with pcp in the next 1-3 days. We also discussed returning to the ED immediately if new or worsening sx occur. We discussed the sx which are most concerning (e.g., numbness, tingling, weakness, dizziness, confusion) that necessitate immediate return. Any new prescriptions provided to the patient are listed below.  Discharge Medication List as of 11/15/2012  2:40 PM       Randa Spike Mort Sawyers, MD 11/15/12 [phone]

## 2012-11-15 NOTE — ED Notes (Signed)
Patient transported to MRI 

## 2012-11-15 NOTE — ED Notes (Signed)
Pt reports swelling in left ankle as well as left arm numbness and "tight face". 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

## 2012-11-16 LAB — URINE CULTURE
Colony Count: NO GROWTH
Culture: NO GROWTH

## 2013-01-15 ENCOUNTER — Emergency Department (HOSPITAL_COMMUNITY)
Admission: EM | Admit: 2013-01-15 | Discharge: 2013-01-16 | Disposition: A | Discharge status: Home / Self Care | Payer: BC Managed Care – PPO | Attending: Emergency Medicine | Admitting: Emergency Medicine

## 2013-01-15 ENCOUNTER — Encounter (HOSPITAL_COMMUNITY): Payer: Self-pay | Admitting: Emergency Medicine

## 2013-01-15 DIAGNOSIS — I10 Essential (primary) hypertension: Secondary | ICD-10-CM | POA: Insufficient documentation

## 2013-01-15 DIAGNOSIS — H00013 Hordeolum externum right eye, unspecified eyelid: Secondary | ICD-10-CM

## 2013-01-15 DIAGNOSIS — F17200 Nicotine dependence, unspecified, uncomplicated: Secondary | ICD-10-CM | POA: Insufficient documentation

## 2013-01-15 DIAGNOSIS — H00019 Hordeolum externum unspecified eye, unspecified eyelid: Secondary | ICD-10-CM | POA: Insufficient documentation

## 2013-01-15 DIAGNOSIS — H109 Unspecified conjunctivitis: Secondary | ICD-10-CM

## 2013-01-15 DIAGNOSIS — Z79899 Other long term (current) drug therapy: Secondary | ICD-10-CM | POA: Insufficient documentation

## 2013-01-15 NOTE — ED Notes (Addendum)
Pt presents with bilateral eye pain. Pt denies any eye injury. Pt says she has seen drainage from both eyes.

## 2013-01-15 NOTE — ED Notes (Signed)
Pt c/o pain/swelling/drainage right eye; started in left eye a wk ago

## 2013-01-16 MED ORDER — TOBRAMYCIN 0.3 % OP SOLN
2.0000 [drp] | OPHTHALMIC | Status: DC
  Administered 2013-01-16: 2 [drp] via OPHTHALMIC
  Filled 2013-01-16: qty 5

## 2013-01-16 NOTE — ED Provider Notes (Signed)
CSN: [id number]     Arrival date & time 01/15/13  2034 History   First MD Initiated Contact with Patient 01/15/13 2338     Chief Complaint  Patient presents with  . Eye Pain   HPI  History provided by the patient. Patient is a 47 year old female with history of hypertension who presents with complaints of bilateral eye irritation, burning and discharge. Patient states she first began having some irritation and swelling of the right eye. She states that she thought she began to have a stye and was using warm compresses however she has had worsening irritation and now complains of similar irritation to the left eye. Symptoms first began one week ago. She is not using her treatments for symptoms. Denies any associated nasal congestion, rhinorrhea or cough. No fever, chills or sweats.    Past Medical History  Diagnosis Date  . Hypertension    Past Surgical History  Procedure Laterality Date  . Abdominal surgery    . Cesarean section     No family history on file. History  Substance Use Topics  . Smoking status: Current Every Day Smoker -- 0.50 packs/day for 10 years    Types: Cigarettes  . Smokeless tobacco: Never Used  . Alcohol Use: No   OB History   Grav Para Term Preterm Abortions TAB SAB Ect Mult Living                 Review of Systems  Constitutional: Negative for fever, chills and diaphoresis.  HENT: Negative for congestion and rhinorrhea.   Eyes: Positive for pain, discharge, redness and itching. Negative for photophobia.  All other systems reviewed and are negative.    Allergies  Review of patient's allergies indicates no known allergies.  Home Medications   Current Outpatient Rx  Name  Route  Sig  Dispense  Refill  . lisinopril-hydrochlorothiazide (PRINZIDE,ZESTORETIC) 20-25 MG per tablet   Oral   Take 1 tablet by mouth daily.          BP 125/85  Pulse 81  Temp(Src) 98.2 F (36.8 C) (Oral)  Resp 18  SpO2 100% Physical Exam  Nursing note and  vitals reviewed. Constitutional: She is oriented to person, place, and time. She appears well-developed and well-nourished. No distress.  HENT:  Head: Normocephalic.  Mouth/Throat: Oropharynx is clear and moist.  Eyes: EOM are normal. Pupils are equal, round, and reactive to light.  Bilateral conjunctiva erythematous. There is mild swelling with slight pointing to the inner lower right eyelid. Small amounts of mucous discharge in bilateral conjunctiva.  Cardiovascular: Normal rate and regular rhythm.   Pulmonary/Chest: Effort normal and breath sounds normal. No respiratory distress.  Neurological: She is alert and oriented to person, place, and time.  Skin: Skin is warm and dry. No rash noted.  Psychiatric: She has a normal mood and affect. Her behavior is normal.    ED Course  Procedures     COORDINATION OF CARE:  Nursing notes reviewed. Vital signs reviewed. Initial pt interview and examination performed.   1:26 AM-patient seen and evaluated. Patient appears in some discomfort no acute distress. Normal respirations. No stridor.   Treatment plan initiated: Medications  tobramycin (TOBREX) 0.3 % ophthalmic solution 2 drop (not administered)    MDM   1. Conjunctivitis   2. Stye, right        Angus Seller, PA-C 01/16/13 0630

## 2013-01-16 NOTE — ED Provider Notes (Signed)
Medical screening examination/treatment/procedure(s) were performed by non-physician practitioner and as supervising physician I was immediately available for consultation/collaboration.  EKG Interpretation   None         Haille Pardi T Enisa Runyan, MD 01/16/13 1743 

## 2013-08-14 ENCOUNTER — Encounter (HOSPITAL_COMMUNITY): Payer: Self-pay | Admitting: Emergency Medicine

## 2013-08-14 ENCOUNTER — Emergency Department (HOSPITAL_COMMUNITY): Payer: BC Managed Care – PPO

## 2013-08-14 ENCOUNTER — Emergency Department (HOSPITAL_COMMUNITY)
Admission: EM | Admit: 2013-08-14 | Discharge: 2013-08-14 | Disposition: A | Discharge status: Home / Self Care | Payer: BC Managed Care – PPO | Attending: Emergency Medicine | Admitting: Emergency Medicine

## 2013-08-14 DIAGNOSIS — R040 Epistaxis: Secondary | ICD-10-CM | POA: Insufficient documentation

## 2013-08-14 DIAGNOSIS — R059 Cough, unspecified: Secondary | ICD-10-CM | POA: Insufficient documentation

## 2013-08-14 DIAGNOSIS — R42 Dizziness and giddiness: Secondary | ICD-10-CM | POA: Insufficient documentation

## 2013-08-14 DIAGNOSIS — H00016 Hordeolum externum left eye, unspecified eyelid: Secondary | ICD-10-CM

## 2013-08-14 DIAGNOSIS — Z79899 Other long term (current) drug therapy: Secondary | ICD-10-CM | POA: Insufficient documentation

## 2013-08-14 DIAGNOSIS — R0602 Shortness of breath: Secondary | ICD-10-CM | POA: Insufficient documentation

## 2013-08-14 DIAGNOSIS — F17200 Nicotine dependence, unspecified, uncomplicated: Secondary | ICD-10-CM | POA: Insufficient documentation

## 2013-08-14 DIAGNOSIS — H00019 Hordeolum externum unspecified eye, unspecified eyelid: Secondary | ICD-10-CM | POA: Insufficient documentation

## 2013-08-14 DIAGNOSIS — R05 Cough: Secondary | ICD-10-CM | POA: Insufficient documentation

## 2013-08-14 DIAGNOSIS — M79609 Pain in unspecified limb: Secondary | ICD-10-CM | POA: Insufficient documentation

## 2013-08-14 DIAGNOSIS — I10 Essential (primary) hypertension: Secondary | ICD-10-CM | POA: Insufficient documentation

## 2013-08-14 LAB — CBC
HEMATOCRIT: 43.6 % (ref 36.0–46.0)
HEMOGLOBIN: 14.1 g/dL (ref 12.0–15.0)
MCH: 26 pg (ref 26.0–34.0)
MCHC: 32.3 g/dL (ref 30.0–36.0)
MCV: 80.3 fL (ref 78.0–100.0)
Platelets: 307 10*3/uL (ref 150–400)
RBC: 5.43 MIL/uL — AB (ref 3.87–5.11)
RDW: 15.3 % (ref 11.5–15.5)
WBC: 5.9 10*3/uL (ref 4.0–10.5)

## 2013-08-14 LAB — BASIC METABOLIC PANEL
Anion gap: 15 (ref 5–15)
BUN: 8 mg/dL (ref 6–23)
CALCIUM: 9.8 mg/dL (ref 8.4–10.5)
CO2: 26 meq/L (ref 19–32)
CREATININE: 0.79 mg/dL (ref 0.50–1.10)
Chloride: 98 mEq/L (ref 96–112)
GFR calc Af Amer: >90 mL/min (ref >90)
GFR calc non Af Amer: >90 mL/min (ref >90)
GLUCOSE: 85 mg/dL (ref 70–99)
Potassium: 3.8 mEq/L (ref 3.7–5.3)
Sodium: 139 mEq/L (ref 137–147)

## 2013-08-14 LAB — I-STAT TROPONIN, ED: Troponin i, poc: 0.03 ng/mL (ref 0.00–0.08)

## 2013-08-14 MED ORDER — ERYTHROMYCIN 5 MG/GM OP OINT
TOPICAL_OINTMENT | Freq: Once | OPHTHALMIC | Status: AC
  Administered 2013-08-14: 18:00:00 via OPHTHALMIC
  Filled 2013-08-14: qty 3.5

## 2013-08-14 MED ORDER — ALBUTEROL SULFATE HFA 108 (90 BASE) MCG/ACT IN AERS
2.0000 | INHALATION_SPRAY | RESPIRATORY_TRACT | Status: DC | PRN
Start: 2013-08-14 — End: 2013-08-14
  Administered 2013-08-14: 2 via RESPIRATORY_TRACT
  Filled 2013-08-14: qty 6.7

## 2013-08-14 NOTE — ED Notes (Signed)
Patient transported to X-ray 

## 2013-08-14 NOTE — ED Provider Notes (Signed)
CSN: [id number]     Arrival date & time 08/14/13  1501 History   First MD Initiated Contact with Patient 08/14/13 1703     Chief Complaint  Patient presents with  . left arm pain   . Dizziness  . Epistaxis     (Consider location/radiation/quality/duration/timing/severity/associated sxs/prior Treatment) HPI Comments: Patient with history of bronchitis -- presents with complaint of coughing episodes for the past 3 days. Patient presents today because she was having coughing episode which caused her nose to start bleeding. Shortly after her nose started bleeding she started noting streaks of blood in her sputum while coughing. With her intense coughing spells she started to feel lightheaded and dizzy, but has not passed out. Patient has had similar paroxysms of cough in the past but not in the past 2 months. She reports trying to stop smoking. Nose bleed was quickly controlled at home. She is not on any blood thinners. Patient denies risk factors for pulmonary embolism including: unilateral leg swelling, history of DVT/PE/other blood clots, use of estrogens, recent immobilizations, recent surgery, recent travel (>4hr segment), malignancy. She denies URI symptoms. She complains of a developing stye on her R upper eyelid. The onset of this condition was acute. The course is improved. Aggravating factors: none. Alleviating factors: none.      Patient is a 48 y.o. female presenting with dizziness and nosebleeds. The history is provided by the patient.  Dizziness Associated symptoms: no chest pain, no diarrhea, no headaches, no nausea, no palpitations, no shortness of breath and no vomiting   Epistaxis Associated symptoms: cough and dizziness   Associated symptoms: no congestion, no fever, no headaches and no sore throat     Past Medical History  Diagnosis Date  . Hypertension    Past Surgical History  Procedure Laterality Date  . Abdominal surgery    . Cesarean section     History  reviewed. No pertinent family history. History  Substance Use Topics  . Smoking status: Current Every Day Smoker -- 0.50 packs/day for 10 years    Types: Cigarettes  . Smokeless tobacco: Never Used  . Alcohol Use: No   OB History   Grav Para Term Preterm Abortions TAB SAB Ect Mult Living                 Review of Systems  Constitutional: Negative for fever.  HENT: Positive for nosebleeds. Negative for congestion, rhinorrhea and sore throat.   Eyes: Negative for redness.  Respiratory: Positive for cough and wheezing (occasional, chronic). Negative for chest tightness and shortness of breath.   Cardiovascular: Negative for chest pain, palpitations and leg swelling.  Gastrointestinal: Negative for nausea, vomiting, abdominal pain and diarrhea.  Genitourinary: Negative for dysuria.  Musculoskeletal: Negative for myalgias.  Skin: Negative for rash.  Neurological: Positive for dizziness and light-headedness. Negative for headaches.      Allergies  Review of patient's allergies indicates no known allergies.  Home Medications   Prior to Admission medications   Medication Sig Start Date End Date Taking? Authorizing Provider  GARCINIA CAMBOGIA-CHROMIUM PO Take 1 tablet by mouth 3 (three) times daily.   Yes Historical Provider, MD  lisinopril-hydrochlorothiazide (PRINZIDE,ZESTORETIC) 20-25 MG per tablet Take 1 tablet by mouth daily.   Yes Historical Provider, MD  Multiple Vitamin (MULTIVITAMIN WITH MINERALS) TABS tablet Take 1 tablet by mouth daily.   Yes Historical Provider, MD  pregabalin (LYRICA) 75 MG capsule Take 75 mg by mouth See admin instructions. Take one capsule at bedtime for  one week, then twice a day thereafter.   Yes Historical Provider, MD   BP 128/92  Pulse 69  Temp(Src) 98.3 F (36.8 C) (Oral)  Resp 16  SpO2 98% Physical Exam  Nursing note and vitals reviewed. Constitutional: She appears well-developed and well-nourished.  HENT:  Head: Normocephalic and  atraumatic.  Right Ear: External ear normal.  Left Ear: External ear normal.  Mouth/Throat: Oropharynx is clear and moist.  Inflamed nares bilaterally without definitive source of bleeding.   Eyes: Conjunctivae are normal. Right eye exhibits no discharge. Left eye exhibits no discharge.  Neck: Normal range of motion. Neck supple. No JVD present.  Cardiovascular: Normal rate and regular rhythm.   No murmur heard. Pulmonary/Chest: Effort normal and breath sounds normal. No respiratory distress. She has no wheezes. She has no rales.  Abdominal: Soft. There is no tenderness. There is no rebound and no guarding.  Musculoskeletal: She exhibits no edema and no tenderness.  Neurological: She is alert.  Skin: Skin is warm and dry.  Psychiatric: She has a normal mood and affect.    ED Course  Procedures (including critical care time) Labs Review Labs Reviewed  CBC - Abnormal; Notable for the following:    RBC 5.43 (*)    All other components within normal limits  BASIC METABOLIC PANEL  I-STAT TROPOININ, ED    Imaging Review No results found.   EKG Interpretation   Date/Time:  Sunday August 14 2013 15:26:49 EDT Ventricular Rate:  93 PR Interval:  159 QRS Duration: 92 QT Interval:  375 QTC Calculation: 466 R Axis:   59 Text Interpretation:  Sinus rhythm Biatrial enlargement Baseline wander in  lead(s) III V1 V3 V6 Since last tracing rate faster  (15 Nov 2012)  Confirmed by [facility]  MD-I, IVA ([id number]) on 08/14/2013 4:48:43 PM      5:14 PM Patient seen and examined. Work-up reviewed. CXR ordered. Medications ordered.   Vital signs reviewed and are as follows: Filed Vitals:   08/14/13 1520  BP: 128/92  Pulse: 69  Temp: 98.3 F (36.8 C)  Resp: 16   Do not suspect this is true hemoptysis and have low suspicion for PE. Do not feel work-up for PE is indicated.   6:20 PM Pt informed of CXR results. Patient counseled on use of albuterol HFA. Instructed to use 1-2 puffs q 4 hours as  needed for SOB.  Patient urged to return with worsening symptoms or other concerns including CP and SOB. Patient verbalized understanding and agrees with plan.     MDM   Final diagnoses:  Epistaxis  Cough  Stye, left   Nosebleed: controlled PTA. Likely caused by increased pressure due to coughing paroxysms.   Cough: Doubt true hemoptysis. CXR neg. ? Bronchitis, cough due to tobacco use. Albuterol for sx relief. Do not suspect PNA. Pt is PERC neg. Do not suspect ACS given this story, no CP. EKG non-ischemic. Trop ordered as part of protocol, is negative.   Stye: no periorbital cellulitis    Carlisle Cater, PA-C 08/14/13 1822

## 2013-08-14 NOTE — ED Notes (Signed)
Pt reports cough x 3 days, reports she would get dizzy when she coughed. 1 hour ago pt started coughing up blood and nose bleeding. Reports left arm pain 1/10.

## 2013-08-14 NOTE — Discharge Instructions (Signed)
Please read and follow all provided instructions.  Your diagnoses today include:  1. Epistaxis   2. Cough   3. Stye, left     Tests performed today include:  Chest x-ray - does not show any pneumonia or other problems  Blood counts and electrolytes - normal  Blood test for heart muscle damage - normal  Vital signs. See below for your results today.   Medications prescribed:   Albuterol inhaler - medication that opens up your airway  Use inhaler as follows: 1-2 puffs with spacer every 4 hours as needed for wheezing, cough, or shortness of breath.    Erythromycin  - antibiotic eye ointment  Use this medication as follows:  Apply 1/4" of the antibiotic ointment to affected eye up to 6 times a day while awake for 7 days  Take any prescribed medications only as directed.  Home care instructions:  Follow any educational materials contained in this packet.  Follow-up instructions: Please follow-up with your primary care provider in the next 3 days for further evaluation of your symptoms and a recheck if you are not feeling better.   Return instructions:   Please return to the Emergency Department if you experience worsening symptoms.  Please return with worsening wheezing, shortness of breath, or difficulty breathing.  Return with persistent fever above 101F.   Please return if you have any other emergent concerns.  Additional Information:  Your vital signs today were: BP 111/83   Pulse 84   Temp(Src) 98.9 F (37.2 C) (Oral)   Resp 18   SpO2 100% If your blood pressure (BP) was elevated above 135/85 this visit, please have this repeated by your doctor within one month. --------------

## 2013-08-21 NOTE — ED Provider Notes (Signed)
Medical screening examination/treatment/procedure(s) were performed by non-physician practitioner and as supervising physician I was immediately available for consultation/collaboration.   EKG Interpretation   Date/Time:  Sunday August 14 2013 15:26:49 EDT Ventricular Rate:  93 PR Interval:  159 QRS Duration: 92 QT Interval:  375 QTC Calculation: 466 R Axis:   59 Text Interpretation:  Sinus rhythm Biatrial enlargement Baseline wander in  lead(s) III V1 V3 V6 Since last tracing rate faster  (15 Nov 2012)  Confirmed by [facility]  MD-I, IVA ([id number]) on 08/14/2013 4:48:43 PM        Tanna Furry, MD 08/21/13 1104

## 2015-01-01 ENCOUNTER — Emergency Department (HOSPITAL_COMMUNITY)
Admission: EM | Admit: 2015-01-01 | Discharge: 2015-01-01 | Disposition: A | Discharge status: Home / Self Care | Payer: BLUE CROSS/BLUE SHIELD | Attending: Emergency Medicine | Admitting: Emergency Medicine

## 2015-01-01 ENCOUNTER — Emergency Department (HOSPITAL_COMMUNITY): Payer: BLUE CROSS/BLUE SHIELD

## 2015-01-01 ENCOUNTER — Encounter (HOSPITAL_COMMUNITY): Payer: Self-pay | Admitting: Emergency Medicine

## 2015-01-01 DIAGNOSIS — T148 Other injury of unspecified body region: Secondary | ICD-10-CM | POA: Diagnosis not present

## 2015-01-01 DIAGNOSIS — M542 Cervicalgia: Secondary | ICD-10-CM | POA: Diagnosis not present

## 2015-01-01 DIAGNOSIS — F17210 Nicotine dependence, cigarettes, uncomplicated: Secondary | ICD-10-CM | POA: Diagnosis not present

## 2015-01-01 DIAGNOSIS — X58XXXA Exposure to other specified factors, initial encounter: Secondary | ICD-10-CM | POA: Insufficient documentation

## 2015-01-01 DIAGNOSIS — Y9389 Activity, other specified: Secondary | ICD-10-CM | POA: Diagnosis not present

## 2015-01-01 DIAGNOSIS — Y998 Other external cause status: Secondary | ICD-10-CM | POA: Insufficient documentation

## 2015-01-01 DIAGNOSIS — T148XXA Other injury of unspecified body region, initial encounter: Secondary | ICD-10-CM

## 2015-01-01 DIAGNOSIS — R079 Chest pain, unspecified: Secondary | ICD-10-CM | POA: Diagnosis not present

## 2015-01-01 DIAGNOSIS — Z79899 Other long term (current) drug therapy: Secondary | ICD-10-CM | POA: Diagnosis not present

## 2015-01-01 DIAGNOSIS — I10 Essential (primary) hypertension: Secondary | ICD-10-CM | POA: Insufficient documentation

## 2015-01-01 DIAGNOSIS — Y9289 Other specified places as the place of occurrence of the external cause: Secondary | ICD-10-CM | POA: Insufficient documentation

## 2015-01-01 DIAGNOSIS — M25511 Pain in right shoulder: Secondary | ICD-10-CM | POA: Diagnosis present

## 2015-01-01 LAB — BASIC METABOLIC PANEL
Anion gap: 7 (ref 5–15)
BUN: 8 mg/dL (ref 6–20)
CALCIUM: 9.4 mg/dL (ref 8.9–10.3)
CO2: 28 mmol/L (ref 22–32)
CREATININE: 0.78 mg/dL (ref 0.44–1.00)
Chloride: 105 mmol/L (ref 101–111)
GFR calc Af Amer: >60 mL/min (ref >60)
GFR calc non Af Amer: >60 mL/min (ref >60)
GLUCOSE: 84 mg/dL (ref 65–99)
Potassium: 4 mmol/L (ref 3.5–5.1)
Sodium: 140 mmol/L (ref 135–145)

## 2015-01-01 LAB — CBC
HCT: 42.7 % (ref 36.0–46.0)
HEMOGLOBIN: 13.6 g/dL (ref 12.0–15.0)
MCH: 25.5 pg — ABNORMAL LOW (ref 26.0–34.0)
MCHC: 31.9 g/dL (ref 30.0–36.0)
MCV: 80 fL (ref 78.0–100.0)
PLATELETS: 274 10*3/uL (ref 150–400)
RBC: 5.34 MIL/uL — ABNORMAL HIGH (ref 3.87–5.11)
RDW: 15.4 % (ref 11.5–15.5)
WBC: 6.6 10*3/uL (ref 4.0–10.5)

## 2015-01-01 LAB — I-STAT TROPONIN, ED: TROPONIN I, POC: 0 ng/mL (ref 0.00–0.08)

## 2015-01-01 MED ORDER — DIAZEPAM 5 MG PO TABS: 5.0000 mg | ORAL_TABLET | Freq: Two times a day (BID) | ORAL | Status: DC

## 2015-01-01 MED ORDER — KETOROLAC TROMETHAMINE 30 MG/ML IJ SOLN
30.0000 mg | Freq: Once | INTRAMUSCULAR | Status: AC
  Administered 2015-01-01: 30 mg via INTRAMUSCULAR
  Filled 2015-01-01: qty 1

## 2015-01-01 MED ORDER — DIAZEPAM 5 MG/ML IJ SOLN
10.0000 mg | Freq: Once | INTRAMUSCULAR | Status: AC
  Administered 2015-01-01: 10 mg via INTRAMUSCULAR
  Filled 2015-01-01: qty 2

## 2015-01-01 NOTE — ED Notes (Signed)
Pt c/o right arm pain that started yesterday. Pt states pain radiates to chest, left arm and back starting today. Pain is worse with movement. Pt denies any thing that could cause the right arm pain.

## 2015-01-01 NOTE — Discharge Instructions (Signed)
Continue to take Motrin and Tylenol as needed for pain control. Apply heat packs or ice packs to your shoulder as needed while at rest. Take muscle relaxants as needed for muscle spasms. Please follow up closely with her primary care physician. Return without fail for worsening symptoms including difficulty breathing, worsening pain, numbness or weakness, or any other symptoms concerning to you.

## 2015-01-01 NOTE — ED Provider Notes (Signed)
CSN: [id number]     Arrival date & time 01/01/15  1144 History   First MD Initiated Contact with Patient 01/01/15 1541     Chief Complaint  Patient presents with  . Arm Pain  . Chest Pain     (Consider location/radiation/quality/duration/timing/severity/associated sxs/prior Treatment) HPI 49 year old female who presents with right shoulder and neck pain. States that she woke up yesterday morning with achiness at the base of her neck towards her right shoulder. Pain has gradually been worsening throughout the past day, radiating into her anterior chest wall. 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 States that her pain is reproduced and exacerbated by movement of her right arm and palpation of her trapezius muscle. Denies any difficulty breathing, lightheadedness, nausea or vomiting, diaphoresis, recent illnesses, cough, congestion. She has not had any leg swelling or leg pain. Pain is not pleuritic in nature and not associated with exertion. States that she normally cleans her home and walks stairs which is the most exertional activity that she normally does and never feels chest pain, difficulty breathing, or fatigue. Past Medical History  Diagnosis Date  . Hypertension    Past Surgical History  Procedure Laterality Date  . Abdominal surgery    . Cesarean section     History reviewed. No pertinent family history. Social History  Substance Use Topics  . Smoking status: Current Every Day Smoker -- 0.50 packs/day for 10 years    Types: Cigarettes  . Smokeless tobacco: Never Used  . Alcohol Use: No   OB History    No data available     Review of Systems 10/14 systems reviewed and are negative other than those stated in the HPI    Allergies  Review of patient's allergies indicates no known allergies.  Home Medications   Prior to Admission medications   Medication Sig Start Date End Date  Taking? Authorizing Provider  GARCINIA CAMBOGIA-CHROMIUM PO Take 1 tablet by mouth 3 (three) times daily.    Historical Provider, MD  lisinopril-hydrochlorothiazide (PRINZIDE,ZESTORETIC) 20-25 MG per tablet Take 1 tablet by mouth daily.    Historical Provider, MD  Multiple Vitamin (MULTIVITAMIN WITH MINERALS) TABS tablet Take 1 tablet by mouth daily.    Historical Provider, MD  pregabalin (LYRICA) 75 MG capsule Take 75 mg by mouth See admin instructions. Take one capsule at bedtime for one week, then twice a day thereafter.    Historical Provider, MD   BP 170/95 mmHg  Pulse 64  Temp(Src) 98.6 F (37 C) (Oral)  Resp 18  Ht 5' 1.5" (1.562 m)  Wt 236 lb (107.049 kg)  BMI 43.88 kg/m2  SpO2 97% Physical Exam Physical Exam  Nursing note and vitals reviewed. Constitutional: Well developed, well nourished, non-toxic, and in no acute distress Head: Normocephalic and atraumatic.  Mouth/Throat: Oropharynx is clear and moist.  Neck: Normal range of motion. Neck supple. No cervical spine tenderness. Tenderness to palpation at base of the neck over right paraspinal muscles to the right trapezius muscle Cardiovascular: Normal rate and regular rhythm.  No edema. +2 radial pulses bilaterally Pulmonary/Chest: Effort normal and breath sounds normal.  Abdominal: Soft. There is no tenderness. There is no rebound and no guarding.  Musculoskeletal: No deformities  Neurological: Alert, no facial droop, fluent speech, moves all extremities symmetrically Skin: Skin is warm and dry.  Psychiatric: Cooperative  ED Course  Procedures (including critical care time) Labs Review  Labs Reviewed  CBC - Abnormal; Notable for the following:    RBC 5.34 (*)    MCH 25.5 (*)    All other components within normal limits  BASIC METABOLIC PANEL  I-STAT TROPOININ, ED    Imaging Review Dg Chest 2 View  01/01/2015  CLINICAL DATA:  Upper chest pain, right arm pain for 2 days. EXAM: CHEST  2 VIEW COMPARISON:  08/14/2013  FINDINGS: Mild peribronchial thickening. Heart and mediastinal contours are within normal limits. No focal opacities or effusions. No acute bony abnormality. IMPRESSION: Mild bronchitic changes. Electronically Signed   By: Rolm Baptise M.D.   On: 01/01/2015 12:31   I have personally reviewed and evaluated these images and lab results as part of my medical decision-making.   EKG Interpretation   Date/Time:  Monday January 01 2015 11:58:10 EST Ventricular Rate:  65 PR Interval:  185 QRS Duration: 89 QT Interval:  403 QTC Calculation: 419 R Axis:   45 Text Interpretation:  Sinus rhythm Probable left atrial enlargement No  ischemic changes. No change from prior EKG Confirmed by Wynetta Seith MD, Suan Pyeatt  [phone]) on 01/01/2015 3:42:37 PM      MDM   Final diagnoses:  None    In short, this is a 49 year old female who presents with neck pain, shoulder pain, and chest pain, which is very musculoskeletal in nature. She is nontoxic and overall well-appearing. Vital signs are non-concerning. Pain is fully reproducible with range of motion of her right shoulder as well as palpation of her right paraspinal muscles and trapezius muscle.  She has an EKG showing no acute ischemic changes and no evidence of heart strain. Troponin is negative, and the setting of ongoing pain for 48 hours presentation is not concerning for that of ACS. History also not concerning for PE, dissection, or other serious or toxic intrathoracic processes. Symptoms does not seem radicular in nature and without neuro complaints, and no concern for acute cervical spine process. Treated with toradol and valium with improvement in symptoms. Discussed supportive care for likely muscle strain. Strict return and follow-up instructions reviewed. She expressed understanding of all discharge instructions and felt comfortable with the plan of care.   Forde Dandy, MD 01/02/15 [phone]

## 2017-01-02 ENCOUNTER — Encounter (HOSPITAL_COMMUNITY): Payer: Self-pay

## 2017-01-02 DIAGNOSIS — I10 Essential (primary) hypertension: Secondary | ICD-10-CM | POA: Insufficient documentation

## 2017-01-02 DIAGNOSIS — F17210 Nicotine dependence, cigarettes, uncomplicated: Secondary | ICD-10-CM | POA: Insufficient documentation

## 2017-01-02 DIAGNOSIS — M13862 Other specified arthritis, left knee: Secondary | ICD-10-CM | POA: Insufficient documentation

## 2017-01-02 LAB — CBC
HCT: 42.2 % (ref 36.0–46.0)
HEMOGLOBIN: 14 g/dL (ref 12.0–15.0)
MCH: 25.9 pg — AB (ref 26.0–34.0)
MCHC: 33.2 g/dL (ref 30.0–36.0)
MCV: 78.1 fL (ref 78.0–100.0)
PLATELETS: 278 10*3/uL (ref 150–400)
RBC: 5.4 MIL/uL — AB (ref 3.87–5.11)
RDW: 15.2 % (ref 11.5–15.5)
WBC: 6.9 10*3/uL (ref 4.0–10.5)

## 2017-01-02 LAB — I-STAT BETA HCG BLOOD, ED (MC, WL, AP ONLY)

## 2017-01-02 LAB — COMPREHENSIVE METABOLIC PANEL
ALK PHOS: 115 U/L (ref 38–126)
ALT: 16 U/L (ref 14–54)
ANION GAP: 8 (ref 5–15)
AST: 16 U/L (ref 15–41)
Albumin: 4.1 g/dL (ref 3.5–5.0)
BUN: 14 mg/dL (ref 6–20)
CALCIUM: 9 mg/dL (ref 8.9–10.3)
CHLORIDE: 105 mmol/L (ref 101–111)
CO2: 25 mmol/L (ref 22–32)
Creatinine, Ser: 0.76 mg/dL (ref 0.44–1.00)
Glucose, Bld: 86 mg/dL (ref 65–99)
Potassium: 3.7 mmol/L (ref 3.5–5.1)
SODIUM: 138 mmol/L (ref 135–145)
Total Bilirubin: 0.5 mg/dL (ref 0.3–1.2)
Total Protein: 7.6 g/dL (ref 6.5–8.1)

## 2017-01-02 LAB — D-DIMER, QUANTITATIVE (NOT AT ARMC): D DIMER QUANT: 0.5 ug{FEU}/mL (ref 0.00–0.50)

## 2017-01-02 NOTE — ED Triage Notes (Signed)
Pt complains of left leg swelling yesterday and tingling on her right side of face and arm today

## 2017-01-03 ENCOUNTER — Emergency Department (HOSPITAL_COMMUNITY): Payer: BLUE CROSS/BLUE SHIELD

## 2017-01-03 ENCOUNTER — Emergency Department (HOSPITAL_COMMUNITY)
Admission: EM | Admit: 2017-01-03 | Discharge: 2017-01-03 | Disposition: A | Discharge status: Home / Self Care | Payer: BLUE CROSS/BLUE SHIELD | Attending: Emergency Medicine | Admitting: Emergency Medicine

## 2017-01-03 DIAGNOSIS — M1990 Unspecified osteoarthritis, unspecified site: Secondary | ICD-10-CM

## 2017-01-03 DIAGNOSIS — M25562 Pain in left knee: Secondary | ICD-10-CM

## 2017-01-03 LAB — CBG MONITORING, ED: Glucose-Capillary: 111 mg/dL — ABNORMAL HIGH (ref 65–99)

## 2017-01-03 MED ORDER — IBUPROFEN 200 MG PO TABS
600.0000 mg | ORAL_TABLET | Freq: Once | ORAL | Status: AC
  Administered 2017-01-03: 600 mg via ORAL
  Filled 2017-01-03: qty 3

## 2017-01-03 MED ORDER — HYDROCODONE-ACETAMINOPHEN 5-325 MG PO TABS: 1.0000 | ORAL_TABLET | ORAL | 0 refills | Status: DC | PRN

## 2017-01-03 MED ORDER — IBUPROFEN 600 MG PO TABS: 600.0000 mg | ORAL_TABLET | Freq: Three times a day (TID) | ORAL | 0 refills | Status: DC | PRN

## 2017-01-03 MED ORDER — ACETAMINOPHEN 500 MG PO TABS
1000.0000 mg | ORAL_TABLET | Freq: Once | ORAL | Status: AC
  Administered 2017-01-03: 1000 mg via ORAL
  Filled 2017-01-03: qty 2

## 2017-01-03 NOTE — ED Notes (Signed)
Pt to xray

## 2017-01-03 NOTE — ED Provider Notes (Signed)
[facility] DEPT Provider Note   CSN: [id number] Arrival date & time: 01/02/17  2100     History   Chief Complaint No chief complaint on file.   HPI Kristine Brewer is a 51 y.o. female.  HPI Patient is a 51 year old female presents the emergency department with left knee pain and swelling over the past 24 hours.  She reports some recurring left shoulder and arm pain as well but is most focused on her left knee pain.  No prior history of gout.  She has never been told she has arthritis.  She has pain with range of motion of the left knee.  No recent procedures.  Denies fevers and chills.  No numbness or tingling of her left lower extremity.  She denies weakness of her left lower extremity.  Pain is moderate in severity.  She has not tried any medications prior to arrival.    Past Medical History:  Diagnosis Date  . Hypertension     Patient Active Problem List   Diagnosis Date Noted  . Left ankle swelling 11/15/2012  . Arm heaviness 11/15/2012  . Leg heaviness 11/15/2012    Past Surgical History:  Procedure Laterality Date  . ABDOMINAL SURGERY    . CESAREAN SECTION      OB History    No data available       Home Medications    Prior to Admission medications   Medication Sig Start Date End Date Taking? Authorizing Provider  HYDROcodone-acetaminophen (NORCO/VICODIN) 5-325 MG tablet Take 1 tablet by mouth every 4 (four) hours as needed for moderate pain. 01/03/17   Jola Schmidt, MD  ibuprofen (ADVIL,MOTRIN) 600 MG tablet Take 1 tablet (600 mg total) by mouth every 8 (eight) hours as needed. 01/03/17   Jola Schmidt, MD    Family History History reviewed. No pertinent family history.  Social History Social History   Tobacco Use  . Smoking status: Current Every Day Smoker    Packs/day: 0.50    Years: 10.00    Pack years: 5.00    Types: Cigarettes  . Smokeless tobacco: Never Used  Substance Use Topics  . Alcohol use: No  .  Drug use: No     Allergies   Patient has no known allergies.   Review of Systems Review of Systems  All other systems reviewed and are negative.    Physical Exam Updated Vital Signs BP 140/71 (BP Location: Right Arm)   Pulse 89   Temp 98 F (36.7 C) (Oral)   Resp 18   Ht 5\' 1"  (1.549 m)   Wt 117.9 kg (260 lb)   SpO2 98%   BMI 49.13 kg/m   Physical Exam Physical Exam General: Well-appearing HEENT: Atraumatic.  Normocephalic Eyes: Extraocular movements are normal Cardiovascular regular rate and rhythm Pulmonary: Lungs are clear bilaterally Abdomen: Soft nontender Extremity: Painful range of motion of the left knee with small left knee joint effusion noted.  No significant warmth of the left knee or erythema.  Normal PT and DP pulse in the left lower extremity.  No swelling of the left lower extremity as compared to the right Neuro: Alert and oriented x3 Psych: Affect normal   ED Treatments / Results  Labs (all labs ordered are listed, but only abnormal results are displayed) Labs Reviewed  CBC - Abnormal; Notable for the following components:      Result Value   RBC 5.40 (*)    MCH 25.9 (*)    All  other components within normal limits  CBG MONITORING, ED - Abnormal; Notable for the following components:   Glucose-Capillary 111 (*)    All other components within normal limits  COMPREHENSIVE METABOLIC PANEL  D-DIMER, QUANTITATIVE (NOT AT [facility])  I-STAT BETA HCG BLOOD, ED ([facility], [facility], [facility] ONLY)    EKG  EKG Interpretation None       Radiology Dg Knee Complete 4 Views Left  Result Date: 01/03/2017 CLINICAL DATA:  Left knee pain. Chronic pain, progressed over the last few days. EXAM: LEFT KNEE - COMPLETE 4+ VIEW COMPARISON:  None. FINDINGS: No evidence of fracture or dislocation. Small to moderate joint effusion. Medial tibiofemoral joint space narrowing with peripheral spurring. Quadriceps and patellar enthesophytes. No bony destructive change. Soft tissues are  unremarkable. IMPRESSION: 1. Joint effusion with mild osteoarthritis. 2. No acute bony abnormality. 3. Extensor enthesopathy Electronically Signed   By: Jeb Levering M.D.   On: 01/03/2017 01:57    Procedures Procedures (including critical care time)  Medications Ordered in ED Medications  ibuprofen (ADVIL,MOTRIN) tablet 600 mg (600 mg Oral Given 01/03/17 0134)  acetaminophen (TYLENOL) tablet 1,000 mg (1,000 mg Oral Given 01/03/17 0134)     Initial Impression / Assessment and Plan / ED Course  I have reviewed the triage vital signs and the nursing notes.  Pertinent labs & imaging results that were available during my care of the patient were reviewed by me and considered in my medical decision making (see chart for details).    Suspect inflammatory arthritis of her left knee causing her small left joint effusion.  Doubt septic arthritis.  May represent gout versus acute flare of arthritis.  Primary care follow-up.  Consultation by orthopedics if she does not improve in 7 days.  Patient understands to return to the ER for new or worsening symptoms  Final Clinical Impressions(s) / ED Diagnoses   Final diagnoses:  Inflammatory arthritis  Acute pain of left knee    ED Discharge Orders        Ordered    ibuprofen (ADVIL,MOTRIN) 600 MG tablet  Every 8 hours PRN     01/03/17 0234    HYDROcodone-acetaminophen (NORCO/VICODIN) 5-325 MG tablet  Every 4 hours PRN     01/03/17 0234        Jola Schmidt, MD 01/03/17 [phone]

## 2017-02-11 ENCOUNTER — Emergency Department (HOSPITAL_COMMUNITY): Payer: BLUE CROSS/BLUE SHIELD

## 2017-02-11 ENCOUNTER — Emergency Department (HOSPITAL_COMMUNITY)
Admission: EM | Admit: 2017-02-11 | Discharge: 2017-02-11 | Disposition: A | Discharge status: Home / Self Care | Payer: BLUE CROSS/BLUE SHIELD | Attending: Emergency Medicine | Admitting: Emergency Medicine

## 2017-02-11 ENCOUNTER — Encounter (HOSPITAL_COMMUNITY): Payer: Self-pay | Admitting: Emergency Medicine

## 2017-02-11 DIAGNOSIS — R509 Fever, unspecified: Secondary | ICD-10-CM | POA: Insufficient documentation

## 2017-02-11 DIAGNOSIS — R197 Diarrhea, unspecified: Secondary | ICD-10-CM | POA: Insufficient documentation

## 2017-02-11 DIAGNOSIS — J189 Pneumonia, unspecified organism: Secondary | ICD-10-CM | POA: Insufficient documentation

## 2017-02-11 DIAGNOSIS — F17210 Nicotine dependence, cigarettes, uncomplicated: Secondary | ICD-10-CM | POA: Insufficient documentation

## 2017-02-11 DIAGNOSIS — I10 Essential (primary) hypertension: Secondary | ICD-10-CM | POA: Insufficient documentation

## 2017-02-11 LAB — COMPREHENSIVE METABOLIC PANEL
ALBUMIN: 3.9 g/dL (ref 3.5–5.0)
ALT: 24 U/L (ref 14–54)
AST: 36 U/L (ref 15–41)
Alkaline Phosphatase: 103 U/L (ref 38–126)
Anion gap: 11 (ref 5–15)
BILIRUBIN TOTAL: 0.5 mg/dL (ref 0.3–1.2)
BUN: 8 mg/dL (ref 6–20)
CHLORIDE: 97 mmol/L — AB (ref 101–111)
CO2: 27 mmol/L (ref 22–32)
Calcium: 8.6 mg/dL — ABNORMAL LOW (ref 8.9–10.3)
Creatinine, Ser: 0.87 mg/dL (ref 0.44–1.00)
GFR calc Af Amer: >60 mL/min (ref >60)
GFR calc non Af Amer: >60 mL/min (ref >60)
GLUCOSE: 108 mg/dL — AB (ref 65–99)
POTASSIUM: 3.5 mmol/L (ref 3.5–5.1)
SODIUM: 135 mmol/L (ref 135–145)
TOTAL PROTEIN: 8.1 g/dL (ref 6.5–8.1)

## 2017-02-11 LAB — URINALYSIS, ROUTINE W REFLEX MICROSCOPIC
Bilirubin Urine: NEGATIVE
Glucose, UA: NEGATIVE mg/dL
KETONES UR: 5 mg/dL — AB
Nitrite: NEGATIVE
Specific Gravity, Urine: 1.039 — ABNORMAL HIGH (ref 1.005–1.030)
pH: 5 (ref 5.0–8.0)

## 2017-02-11 LAB — CBC
HEMATOCRIT: 42 % (ref 36.0–46.0)
Hemoglobin: 14 g/dL (ref 12.0–15.0)
MCH: 25.7 pg — AB (ref 26.0–34.0)
MCHC: 33.3 g/dL (ref 30.0–36.0)
MCV: 77.1 fL — AB (ref 78.0–100.0)
Platelets: 230 10*3/uL (ref 150–400)
RBC: 5.45 MIL/uL — ABNORMAL HIGH (ref 3.87–5.11)
RDW: 14.8 % (ref 11.5–15.5)
WBC: 3.8 10*3/uL — ABNORMAL LOW (ref 4.0–10.5)

## 2017-02-11 LAB — LIPASE, BLOOD: Lipase: 32 U/L (ref 11–51)

## 2017-02-11 LAB — POC URINE PREG, ED: PREG TEST UR: NEGATIVE

## 2017-02-11 MED ORDER — LEVOFLOXACIN 750 MG PO TABS: 750.0000 mg | ORAL_TABLET | Freq: Every day | ORAL | 0 refills | Status: DC

## 2017-02-11 NOTE — ED Notes (Signed)
Patient transported to X-ray. Patient aware we need urine sample when she gets back.

## 2017-02-11 NOTE — ED Notes (Signed)
Ambulated patient with pulse ox. Patient started at 100% and stayed at 95% walking down hallway and back.

## 2017-02-11 NOTE — ED Provider Notes (Signed)
[facility] DEPT Provider Note   CSN: [id number] Arrival date & time: 02/11/17  1051     History   Chief Complaint Chief Complaint  Patient presents with  . Cough  . Diarrhea    HPI Kristine Brewer is a 52 y.o. female with a history of hypertension and tobacco abuse who presents the emergency department with complaint of cough for the past 4 days.  Patient states that the cough has been productive with yellow mucus sputum.  States she is having associated congestion, bilateral ear pressure, subjective fevers, chills, and some mild dyspnea that is only occurring with coughing, otherwise no shortness of breath.  States that with the excess coughing she started to develop some upper abdominal soreness over the past 2 days with coughing.  Patient states she is not having any abdominal pain without coughing.  States the pain is a 5 out of 10 in severity when she is coughing, describes it as a soreness, no pain at present.  No other alleviating or aggravating factors.  She has had some nonbloody non-mucousy diarrhea over the past 2 days as well. No recent foreign travel or antibiotic use.  Denies chest pain, palpitations, nausea, vomiting, or sore throat. Denies leg pain/swelling, hemoptysis, recent surgery/trauma, recent long travel, hormone use, personal hx of cancer, or hx of DVT/PE.     HPI  Past Medical History:  Diagnosis Date  . Hypertension     Patient Active Problem List   Diagnosis Date Noted  . Left ankle swelling 11/15/2012  . Arm heaviness 11/15/2012  . Leg heaviness 11/15/2012    Past Surgical History:  Procedure Laterality Date  . ABDOMINAL SURGERY    . CESAREAN SECTION      OB History    No data available       Home Medications    Prior to Admission medications   Medication Sig Start Date End Date Taking? Authorizing Provider  HYDROcodone-acetaminophen (NORCO/VICODIN) 5-325 MG tablet Take 1 tablet by mouth every 4 (four)  hours as needed for moderate pain. 01/03/17   Jola Schmidt, MD  ibuprofen (ADVIL,MOTRIN) 600 MG tablet Take 1 tablet (600 mg total) by mouth every 8 (eight) hours as needed. 01/03/17   Jola Schmidt, MD    Family History History reviewed. No pertinent family history.  Social History Social History   Tobacco Use  . Smoking status: Current Every Day Smoker    Packs/day: 0.50    Years: 10.00    Pack years: 5.00    Types: Cigarettes  . Smokeless tobacco: Never Used  Substance Use Topics  . Alcohol use: No  . Drug use: No     Allergies   Patient has no known allergies.   Review of Systems Review of Systems  Constitutional: Positive for chills and fever (subjective).  HENT: Positive for congestion and ear pain. Negative for sore throat.   Eyes: Negative for visual disturbance.  Respiratory: Positive for cough and shortness of breath (only with couging, otherwise none). Negative for wheezing.   Cardiovascular: Negative for chest pain, palpitations and leg swelling.  Gastrointestinal: Positive for abdominal pain (only with coughing) and diarrhea. Negative for blood in stool, constipation, nausea, rectal pain and vomiting.  Genitourinary: Negative for dysuria, frequency and urgency.  Neurological: Negative for weakness and numbness.  All other systems reviewed and are negative.   Physical Exam Updated Vital Signs BP 127/87 (BP Location: Right Arm)   Pulse 99   Temp 99.5 F (37.5 C) (  Oral)   Resp 18   SpO2 95%   Physical Exam  Constitutional: She appears well-developed and well-nourished.  Non-toxic appearance. No distress.  HENT:  Head: Normocephalic and atraumatic.  Right Ear: Tympanic membrane is not perforated, not erythematous, not retracted and not bulging.  Left Ear: Tympanic membrane is not perforated, not erythematous, not retracted and not bulging.  Nose: Mucosal edema present. Right sinus exhibits no maxillary sinus tenderness and no frontal sinus tenderness.  Left sinus exhibits no maxillary sinus tenderness and no frontal sinus tenderness.  Mouth/Throat: Uvula is midline and oropharynx is clear and moist. No oropharyngeal exudate or posterior oropharyngeal erythema.  Eyes: Conjunctivae are normal. Pupils are equal, round, and reactive to light. Right eye exhibits no discharge. Left eye exhibits no discharge.  Neck: Normal range of motion. Neck supple.  Cardiovascular: Normal rate and regular rhythm.  No murmur heard. Pulmonary/Chest: Effort normal. No respiratory distress. She has decreased breath sounds (mild diffusely, more so on the R). She has no wheezes. She has rhonchi (R sided diffusely). She has no rales.  Abdominal: Soft. She exhibits no distension. There is no tenderness. There is no rigidity, no rebound, no guarding, no CVA tenderness, no tenderness at McBurney's point and negative Murphy's sign.  Musculoskeletal: She exhibits no edema.  Lymphadenopathy:    She has no cervical adenopathy.  Neurological: She is alert.  Skin: Skin is warm and dry. No rash noted.  Psychiatric: She has a normal mood and affect. Her behavior is normal.  Nursing note and vitals reviewed.  ED Treatments / Results  Labs Results for orders placed or performed during the hospital encounter of 02/11/17  Lipase, blood  Result Value Ref Range   Lipase 32 11 - 51 U/L  Comprehensive metabolic panel  Result Value Ref Range   Sodium 135 135 - 145 mmol/L   Potassium 3.5 3.5 - 5.1 mmol/L   Chloride 97 (L) 101 - 111 mmol/L   CO2 27 22 - 32 mmol/L   Glucose, Bld 108 (H) 65 - 99 mg/dL   BUN 8 6 - 20 mg/dL   Creatinine, Ser 0.87 0.44 - 1.00 mg/dL   Calcium 8.6 (L) 8.9 - 10.3 mg/dL   Total Protein 8.1 6.5 - 8.1 g/dL   Albumin 3.9 3.5 - 5.0 g/dL   AST 36 15 - 41 U/L   ALT 24 14 - 54 U/L   Alkaline Phosphatase 103 38 - 126 U/L   Total Bilirubin 0.5 0.3 - 1.2 mg/dL   GFR calc non Af Amer >60 >60 mL/min   GFR calc Af Amer >60 >60 mL/min   Anion gap 11 5 - 15  CBC   Result Value Ref Range   WBC 3.8 (L) 4.0 - 10.5 K/uL   RBC 5.45 (H) 3.87 - 5.11 MIL/uL   Hemoglobin 14.0 12.0 - 15.0 g/dL   HCT 42.0 36.0 - 46.0 %   MCV 77.1 (L) 78.0 - 100.0 fL   MCH 25.7 (L) 26.0 - 34.0 pg   MCHC 33.3 30.0 - 36.0 g/dL   RDW 14.8 11.5 - 15.5 %   Platelets 230 150 - 400 K/uL  Urinalysis, Routine w reflex microscopic  Result Value Ref Range   Color, Urine AMBER (A) YELLOW   APPearance CLOUDY (A) CLEAR   Specific Gravity, Urine 1.039 (H) 1.005 - 1.030   pH 5.0 5.0 - 8.0   Glucose, UA NEGATIVE NEGATIVE mg/dL   Hgb urine dipstick SMALL (A) NEGATIVE   Bilirubin Urine  NEGATIVE NEGATIVE   Ketones, ur 5 (A) NEGATIVE mg/dL   Protein, ur >=300 (A) NEGATIVE mg/dL   Nitrite NEGATIVE NEGATIVE   Leukocytes, UA SMALL (A) NEGATIVE   RBC / HPF 0-5 0 - 5 RBC/hpf   WBC, UA 6-30 0 - 5 WBC/hpf   Bacteria, UA MANY (A) NONE SEEN   Squamous Epithelial / LPF 0-5 (A) NONE SEEN   Mucus PRESENT    Hyaline Casts, UA PRESENT   POC urine preg, ED  Result Value Ref Range   Preg Test, Ur NEGATIVE NEGATIVE   EKG  EKG Interpretation None       Radiology Dg Chest 2 View  Result Date: 02/11/2017 CLINICAL DATA:  Productive cough and diarrhea. Chest and abdominal pain. Smoker. EXAM: CHEST  2 VIEW COMPARISON:  Chest radiograph January 01, 2015 FINDINGS: Cardiomediastinal silhouette is normal. Bronchitic changes with patchy bilateral airspace opacities, bandlike density LEFT upper lobe. No pleural effusion. No pneumothorax. Soft tissue planes and included osseous structures are non suspicious. IMPRESSION: Multifocal bronchopneumonia, LEFT upper lobe atelectasis/scar. Followup PA and lateral chest X-ray is recommended in 3-4 weeks following trial of antibiotic therapy to ensure resolution and exclude underlying malignancy. Electronically Signed   By: Elon Alas M.D.   On: 02/11/2017 13:46   Procedures Procedures (including critical care time)  Medications Ordered in ED Medications -  No data to display   Initial Impression / Assessment and Plan / ED Course  I have reviewed the triage vital signs and the nursing notes.  Pertinent labs & imaging results that were available during my care of the patient were reviewed by me and considered in my medical decision making (see chart for details).   Patient presents with multiple complaints including cough, abdominal discomfort only with coughing, and nonbloody diarrhea.  Patient is nontoxic-appearing, in no apparent distress, vitals are within normal limits. Labs grossly unremarkable, WBC noted to be 3.8, no significant abnormalities with electrolytes, kidney, or liver function. Patient with asymptomatic bacteruria as well as proteinuria, do not think UTI tx is necessary, discussed need for repeat UA with the patient given proteinuria. Patient with benign abdominal exam. On repeat exam patient does not have a surgical abdomen and there are no peritoneal signs.  No indication of appendicitis, bowel obstruction, bowel perforation, cholecystitis, diverticulitis, pancreatitis, or ectopic pregnancy.   Chest x-ray revealed multifocal bronchopneumonia with recommended follow-up imaging in 3-4 weeks to ensure resolution and exclude underlying malignancy.  Patient's vitals remain WNL, no signs of respiratory distress, she denies dyspnea. Patient is without chest pain, no leg pain/swelling, hemoptysis, recent surgery/trauma, recent long travel, hormone use, personal hx of cancer, or hx of DVT/PE therefore doubt pulmonary embolism.   14:24: RE-EVAL: Patient resting comfortably in bed. Discussed results with patient and her mother. Will check ambulatory pulse ox.   15:45: RE-EVAL: Patient ambulated, maintained SpO2 95-100%, no dyspnea with ambulation.   Findings and plan of care discussed with supervising physician Dr. Lita Mains who is in agreement with plan for discharge home with Levaquin and PCP follow up.   I discussed results, treatment plan,  need for PCP follow-up (including need for follow up UA and CXR), and strict return precautions with the patient and her mother. Provided opportunity for questions, patient and her mother confirmed understanding and are in agreement with plan.    Vitals:   02/11/17 1327 02/11/17 1500  BP:  126/87  Pulse: 97 99  Resp:  18  Temp:    SpO2: 90% 95%  Final Clinical Impressions(s) / ED Diagnoses   Final diagnoses:  Multifocal pneumonia    ED Discharge Orders        Ordered    levofloxacin (LEVAQUIN) 750 MG tablet  Daily     02/11/17 [address] PA-C 02/11/17 1525    Julianne Rice, MD 02/11/17 1614

## 2017-02-11 NOTE — ED Triage Notes (Signed)
Patient c/o productive cough and diarrhea since Friday. denies vomiting or urinary problems.

## 2017-02-11 NOTE — Discharge Instructions (Signed)
You were seen in the emergency department today and diagnosed with pneumonia.  We are sending you home with a prescription for Levaquin, this is an antibiotic used to treat the infection. Please take all of your antibiotics until finished. You may develop abdominal discomfort or diarrhea from the antibiotic.  You may help offset this with probiotics which you can buy at the store (ask your pharmacist if unable to find) or get probiotics in the form of eating yogurt. Do not eat or take the probiotics until 2 hours after your antibiotic. If you are unable to tolerate these side effects follow-up with your primary care provider or return to the emergency department.   If you begin to experience any blistering, rashes, swelling, or difficulty breathing seek medical care for evaluation of potentially more serious side effects.   Please be aware that this medication may interact with other medications you are taking, please be sure to discuss your medication list with your pharmacist.   You will need to follow-up with a primary care provider in the next 5 days for reevaluation of your symptoms.  Return to the emergency department for any new or worsening symptoms including but not limited to difficulty breathing, chest pain, worsening cough, or any other concerns you may have.   Additionally, you will need a repeat chest x-ray in 3-4 weeks in order to ensure clearance of the infection.  We would also like you to have your urine rechecked as was protein in it today.  These are both things you can discuss with your primary care provider.  If you do not have a primary care provider multiple options in the area have been provided in your discharge instructions including our [place] [facility].  Please call your primary care provider today or tomorrow in order to set up an appointment.

## 2017-07-20 ENCOUNTER — Encounter (HOSPITAL_BASED_OUTPATIENT_CLINIC_OR_DEPARTMENT_OTHER): Payer: Self-pay | Admitting: Emergency Medicine

## 2017-07-20 ENCOUNTER — Other Ambulatory Visit: Payer: Self-pay

## 2017-07-20 ENCOUNTER — Emergency Department (HOSPITAL_BASED_OUTPATIENT_CLINIC_OR_DEPARTMENT_OTHER)
Admission: EM | Admit: 2017-07-20 | Discharge: 2017-07-20 | Disposition: A | Discharge status: Home / Self Care | Payer: Self-pay | Attending: Emergency Medicine | Admitting: Emergency Medicine

## 2017-07-20 DIAGNOSIS — H00019 Hordeolum externum unspecified eye, unspecified eyelid: Secondary | ICD-10-CM

## 2017-07-20 DIAGNOSIS — F17210 Nicotine dependence, cigarettes, uncomplicated: Secondary | ICD-10-CM | POA: Insufficient documentation

## 2017-07-20 DIAGNOSIS — H00011 Hordeolum externum right upper eyelid: Secondary | ICD-10-CM | POA: Insufficient documentation

## 2017-07-20 DIAGNOSIS — H109 Unspecified conjunctivitis: Secondary | ICD-10-CM | POA: Insufficient documentation

## 2017-07-20 DIAGNOSIS — Z79899 Other long term (current) drug therapy: Secondary | ICD-10-CM | POA: Insufficient documentation

## 2017-07-20 DIAGNOSIS — I10 Essential (primary) hypertension: Secondary | ICD-10-CM | POA: Insufficient documentation

## 2017-07-20 HISTORY — DX: Obesity, unspecified: E66.9

## 2017-07-20 HISTORY — DX: Fibromyalgia: M79.7

## 2017-07-20 MED ORDER — ERYTHROMYCIN 5 MG/GM OP OINT
TOPICAL_OINTMENT | Freq: Four times a day (QID) | OPHTHALMIC | Status: DC
  Administered 2017-07-20: 1 via OPHTHALMIC
  Filled 2017-07-20: qty 3.5

## 2017-07-20 NOTE — ED Provider Notes (Signed)
[facility] EMERGENCY DEPARTMENT Provider Note   CSN: [id number] Arrival date & time: 07/20/17  1639     History   Chief Complaint Chief Complaint  Patient presents with  . Eye Problem    HPI Kristine Brewer is a 52 y.o. female.  Kristine Brewer is a 52 y.o. Female with a history of hypertension, fibromyalgia and obesity, who presents to the emergency department for evaluation of swelling redness and irritation of both eyes and upper eyelids.  Patient reports history of multiple styes in the past and initially she thought that what was going on when symptoms started in the left eye about a 5 days ago, but she then developed similar pain swelling and irritation in the right eye as well and over the past 2 to 3 days it has been getting worse and she started to have some white watery drainage from the eyes.  She has been using warm compresses regularly without improvement.  Patient reports the eyes feel more irritated and the eyelids feels sore but the eye itself is not very painful, she denies any change in vision, does not wear contacts or glasses.  No difficulty moving the eyes, no associated headache.  No fevers or chills.     Past Medical History:  Diagnosis Date  . Fibromyalgia   . Hypertension   . Obesity     Patient Active Problem List   Diagnosis Date Noted  . Left ankle swelling 11/15/2012  . Arm heaviness 11/15/2012  . Leg heaviness 11/15/2012    Past Surgical History:  Procedure Laterality Date  . ABDOMINAL SURGERY    . CESAREAN SECTION    . UTERINE FIBROID SURGERY       OB History   None      Home Medications    Prior to Admission medications   Medication Sig Start Date End Date Taking? Authorizing Provider  HYDROcodone-acetaminophen (NORCO/VICODIN) 5-325 MG tablet Take 1 tablet by mouth every 4 (four) hours as needed for moderate pain. 01/03/17   Jola Schmidt, MD  ibuprofen (ADVIL,MOTRIN) 600 MG tablet Take 1 tablet (600 mg total) by  mouth every 8 (eight) hours as needed. 01/03/17   Jola Schmidt, MD  levofloxacin (LEVAQUIN) 750 MG tablet Take 1 tablet (750 mg total) by mouth daily. 02/11/17   Petrucelli, Glynda Jaeger, PA-C    Family History No family history on file.  Social History Social History   Tobacco Use  . Smoking status: Current Every Day Smoker    Packs/day: 0.50    Years: 10.00    Pack years: 5.00    Types: Cigarettes  . Smokeless tobacco: Never Used  Substance Use Topics  . Alcohol use: No  . Drug use: No     Allergies   Patient has no known allergies.   Review of Systems Review of Systems  Constitutional: Negative for chills and fever.  HENT: Negative for facial swelling.   Eyes: Positive for pain, discharge and redness. Negative for photophobia and visual disturbance.  Skin: Negative for color change and rash.  Neurological: Negative for syncope, facial asymmetry, speech difficulty, weakness, light-headedness, numbness and headaches.     Physical Exam Updated Vital Signs BP (!) 170/79 (BP Location: Left Arm)   Pulse 79   Temp 98.1 F (36.7 C) (Oral)   Resp 16   Ht 5\' 1"  (1.549 m)   Wt 113.4 kg (250 lb)   LMP 05/02/2011   SpO2 99%   BMI 47.24 kg/m  Physical Exam  Constitutional: She appears well-developed and well-nourished. No distress.  Well-appearing and in no acute distress  HENT:  Head: Normocephalic and atraumatic.  Eyes: Pupils are equal, round, and reactive to light. EOM are normal. Right eye exhibits no discharge. Left eye exhibits no discharge.  Bilateral eyes with swelling of the upper eyelids, there is a stye noted over the upper lid, bilateral sclerae injected, left more so than right, left eye with mild chemosis, some watery discharge present, EOMs intact bilaterally without pain, no consensual pain with pupillary constriction, PERRLA, no proptosis or significant periorbital edema  Pulmonary/Chest: Effort normal. No respiratory distress.  Neurological: She is  alert. Coordination normal.  Skin: Skin is warm and dry. Capillary refill takes less than 2 seconds. She is not diaphoretic.  Psychiatric: She has a normal mood and affect. Her behavior is normal.  Nursing note and vitals reviewed.    ED Treatments / Results  Labs (all labs ordered are listed, but only abnormal results are displayed) Labs Reviewed - No data to display  EKG None  Radiology No results found.  Procedures Procedures (including critical care time)  Medications Ordered in ED Medications  erythromycin ophthalmic ointment (1 application Both Eyes Given 07/20/17 1727)     Initial Impression / Assessment and Plan / ED Course  I have reviewed the triage vital signs and the nursing notes.  Pertinent labs & imaging results that were available during my care of the patient were reviewed by me and considered in my medical decision making (see chart for details).  Peyton Bottoms presents with symptoms consistent eyelid swelling and eye irritation, with reported purulent drainage, stye noted on exam on the right eye, and some mild signs of conjunctivitis.  PERRLA, EOMs intact, no consensual pain, no proptosis or periorbital edema.  No foreign body sensation given that this is bilateral, corneal abrasion is extremely unlikely.  No change in vision.  No evidence of preseptal or orbital cellulitis.  Pt is  not a contact lens wearer.  Patient will be given erythromycin ophthalmic ointment here in the ED today..  Personal hygiene and frequent handwashing discussed.  Patient advised to followup with ophthalmologist for reevaluation in several days..  Patient verbalizes understanding and is agreeable with discharge.  Final Clinical Impressions(s) / ED Diagnoses   Final diagnoses:  Conjunctivitis of both eyes, unspecified conjunctivitis type  Hordeolum externum, unspecified laterality    ED Discharge Orders    None       Janet Berlin 07/20/17 1736    Virgel Manifold,  MD 07/20/17 2031

## 2017-07-20 NOTE — Discharge Instructions (Signed)
Please use erythromycin ointment 4 times daily and continue with warm compresses.  If symptoms are not improving in 48 hours please follow-up with ophthalmology.  If you develop significantly worsening swelling in the eyes seem pushed forward, you develop pain in the eye or change in vision, fevers or any other new or concerning symptoms please return to the emergency department for reevaluation.

## 2017-07-20 NOTE — ED Triage Notes (Signed)
Swelling, redness, and "soreness" to both eyes/eyelids.

## 2018-07-18 IMAGING — CR DG KNEE COMPLETE 4+V*L*
4 series · 4 of 4 positions shown · non-contrast
Comparison: None.

CLINICAL DATA: Left knee pain. Chronic pain, progressed over the
last few days.

EXAM:
LEFT KNEE - COMPLETE 4+ VIEW

[x knee lat left]
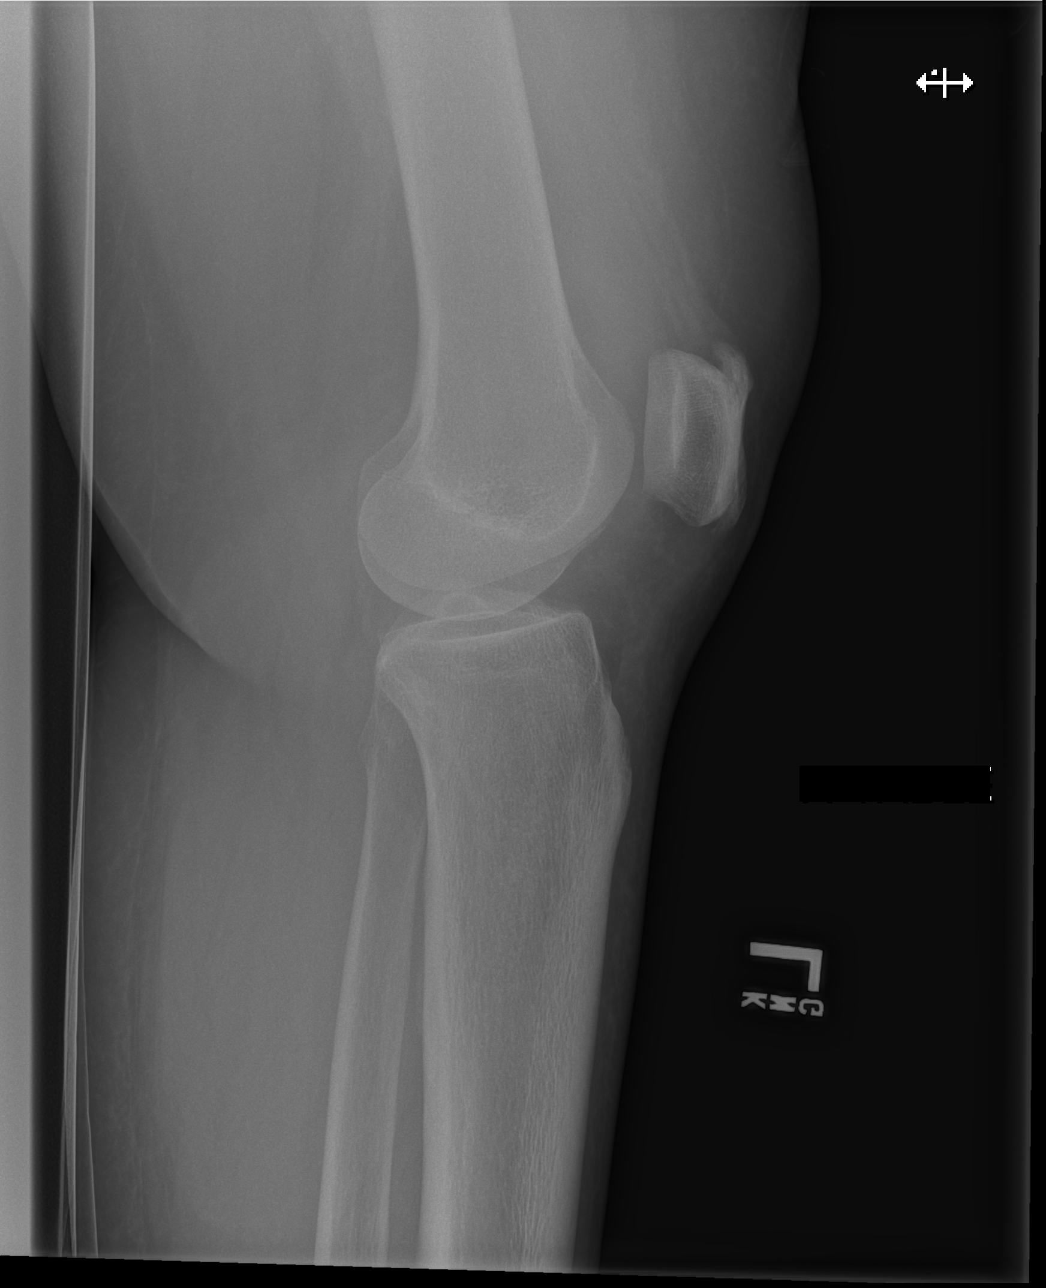

[x knee ap left (1 of 3)]
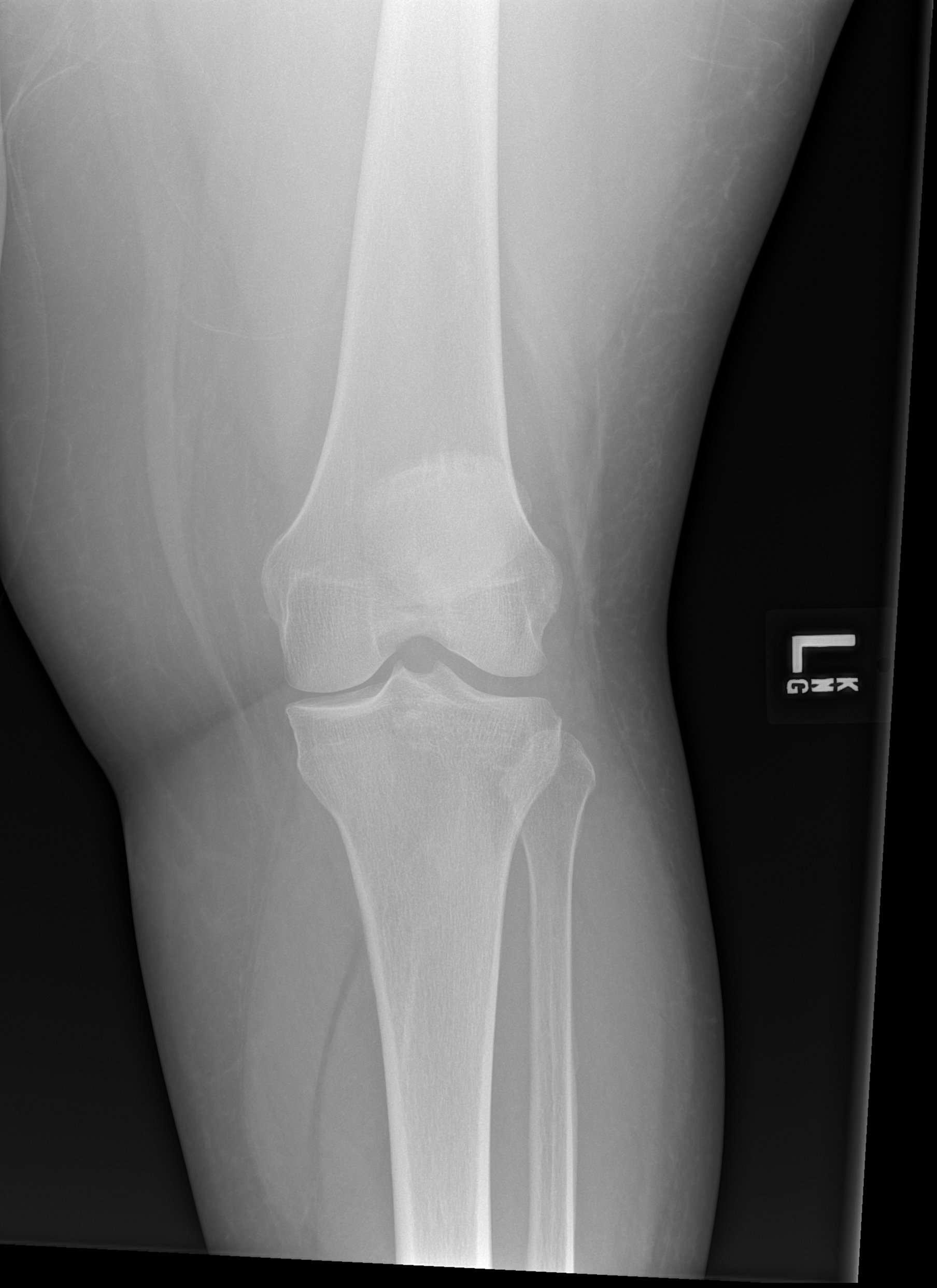

[x knee ap left (2 of 3)]
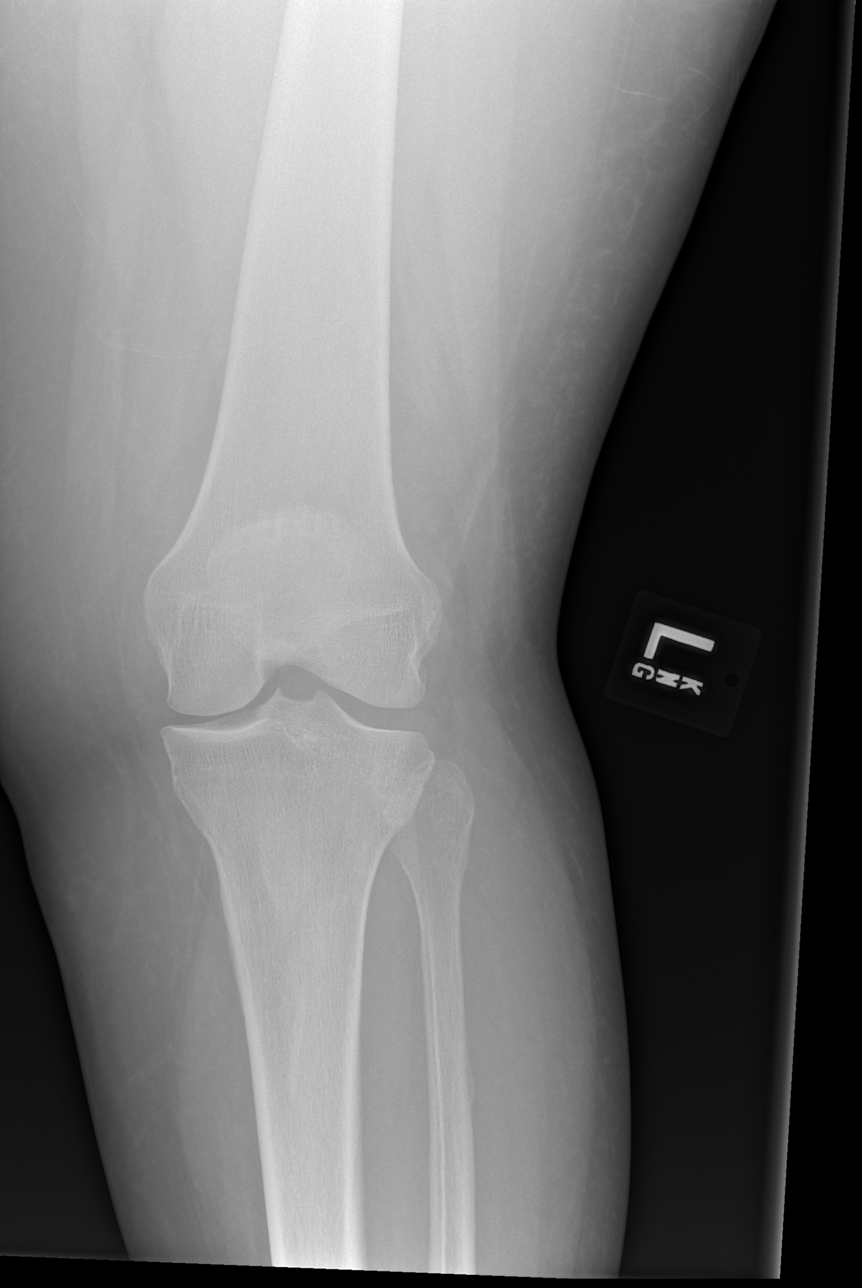

[x knee ap left (3 of 3)]
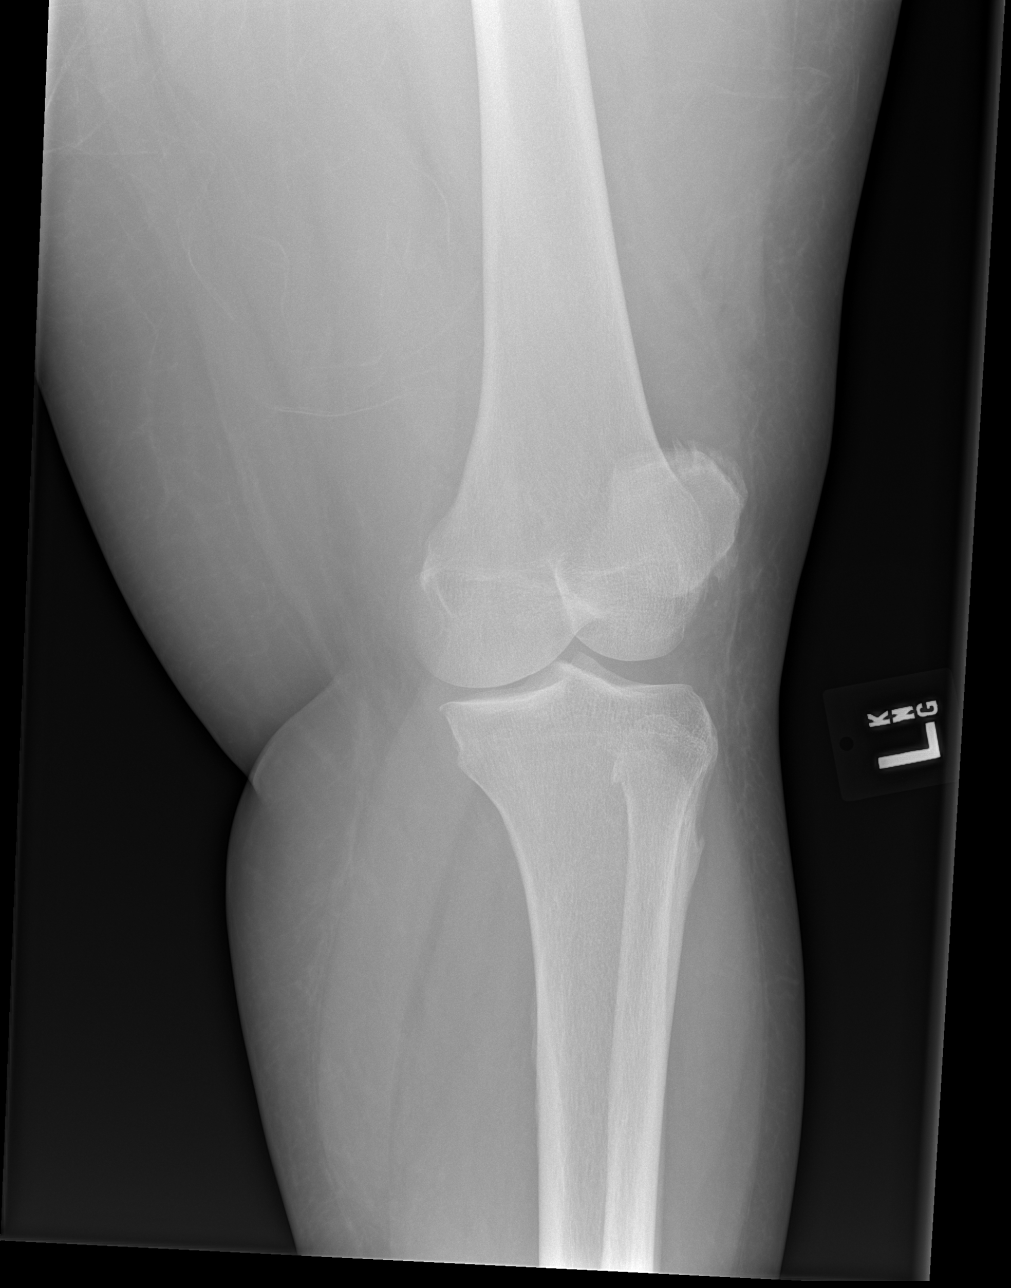

[4 of 4 positions shown; findings below may reference images not displayed]

FINDINGS: No evidence of fracture or dislocation. Small to moderate joint
effusion. Medial tibiofemoral joint space narrowing with peripheral
spurring. Quadriceps and patellar enthesophytes. No bony destructive
change. Soft tissues are unremarkable.
IMPRESSION: 1. Joint effusion with mild osteoarthritis.
2. No acute bony abnormality.
3. Extensor enthesopathy

## 2023-10-07 ENCOUNTER — Other Ambulatory Visit: Payer: Self-pay

## 2023-10-07 ENCOUNTER — Emergency Department (HOSPITAL_COMMUNITY): Payer: Self-pay

## 2023-10-07 ENCOUNTER — Inpatient Hospital Stay (HOSPITAL_COMMUNITY)
Admission: EM | Admit: 2023-10-07 | Discharge: 2023-10-09 | DRG: 390 | Disposition: A | Discharge status: Home / Self Care | Payer: MEDICAID | Attending: Internal Medicine | Admitting: Internal Medicine

## 2023-10-07 ENCOUNTER — Encounter (HOSPITAL_COMMUNITY): Payer: Self-pay

## 2023-10-07 DIAGNOSIS — L03311 Cellulitis of abdominal wall: Secondary | ICD-10-CM | POA: Diagnosis present

## 2023-10-07 DIAGNOSIS — F17210 Nicotine dependence, cigarettes, uncomplicated: Secondary | ICD-10-CM | POA: Diagnosis present

## 2023-10-07 DIAGNOSIS — Z79899 Other long term (current) drug therapy: Secondary | ICD-10-CM

## 2023-10-07 DIAGNOSIS — E66811 Obesity, class 1: Secondary | ICD-10-CM | POA: Diagnosis present

## 2023-10-07 DIAGNOSIS — I160 Hypertensive urgency: Secondary | ICD-10-CM | POA: Diagnosis present

## 2023-10-07 DIAGNOSIS — Z6834 Body mass index (BMI) 34.0-34.9, adult: Secondary | ICD-10-CM

## 2023-10-07 DIAGNOSIS — R1013 Epigastric pain: Principal | ICD-10-CM

## 2023-10-07 DIAGNOSIS — I10 Essential (primary) hypertension: Secondary | ICD-10-CM | POA: Diagnosis present

## 2023-10-07 DIAGNOSIS — G629 Polyneuropathy, unspecified: Secondary | ICD-10-CM | POA: Insufficient documentation

## 2023-10-07 DIAGNOSIS — Z720 Tobacco use: Secondary | ICD-10-CM | POA: Diagnosis present

## 2023-10-07 DIAGNOSIS — K529 Noninfective gastroenteritis and colitis, unspecified: Secondary | ICD-10-CM | POA: Diagnosis present

## 2023-10-07 DIAGNOSIS — R112 Nausea with vomiting, unspecified: Secondary | ICD-10-CM | POA: Diagnosis present

## 2023-10-07 DIAGNOSIS — K56600 Partial intestinal obstruction, unspecified as to cause: Principal | ICD-10-CM | POA: Diagnosis present

## 2023-10-07 DIAGNOSIS — R109 Unspecified abdominal pain: Secondary | ICD-10-CM | POA: Diagnosis present

## 2023-10-07 DIAGNOSIS — M797 Fibromyalgia: Secondary | ICD-10-CM | POA: Diagnosis present

## 2023-10-07 LAB — COMPREHENSIVE METABOLIC PANEL WITH GFR
ALT: 10 U/L (ref 0–44)
AST: 18 U/L (ref 15–41)
Albumin: 4.6 g/dL (ref 3.5–5.0)
Alkaline Phosphatase: 119 U/L (ref 38–126)
Anion gap: 16 — ABNORMAL HIGH (ref 5–15)
BUN: 8 mg/dL (ref 6–20)
CO2: 22 mmol/L (ref 22–32)
Calcium: 10.1 mg/dL (ref 8.9–10.3)
Chloride: 103 mmol/L (ref 98–111)
Creatinine, Ser: 0.77 mg/dL (ref 0.44–1.00)
GFR, Estimated: >60 mL/min (ref >60)
Glucose, Bld: 100 mg/dL — ABNORMAL HIGH (ref 70–99)
Potassium: 3.7 mmol/L (ref 3.5–5.1)
Sodium: 140 mmol/L (ref 135–145)
Total Bilirubin: 0.3 mg/dL (ref 0.0–1.2)
Total Protein: 7.9 g/dL (ref 6.5–8.1)

## 2023-10-07 LAB — CBC
HCT: 39.7 % (ref 36.0–46.0)
Hemoglobin: 12.6 g/dL (ref 12.0–15.0)
MCH: 26.5 pg (ref 26.0–34.0)
MCHC: 31.7 g/dL (ref 30.0–36.0)
MCV: 83.4 fL (ref 80.0–100.0)
Platelets: 179 K/uL (ref 150–400)
RBC: 4.76 MIL/uL (ref 3.87–5.11)
RDW: 14.5 % (ref 11.5–15.5)
WBC: 6.2 K/uL (ref 4.0–10.5)
nRBC: 0 % (ref 0.0–0.2)

## 2023-10-07 LAB — URINALYSIS, ROUTINE W REFLEX MICROSCOPIC
Bacteria, UA: NONE SEEN
Bilirubin Urine: NEGATIVE
Glucose, UA: NEGATIVE mg/dL
Hgb urine dipstick: NEGATIVE
Ketones, ur: 5 mg/dL — AB
Leukocytes,Ua: NEGATIVE
Nitrite: NEGATIVE
Protein, ur: 30 mg/dL — AB
Specific Gravity, Urine: 1.045 — ABNORMAL HIGH (ref 1.005–1.030)
pH: 7 (ref 5.0–8.0)

## 2023-10-07 LAB — BASIC METABOLIC PANEL WITH GFR
Anion gap: 13 (ref 5–15)
BUN: 7 mg/dL (ref 6–20)
CO2: 22 mmol/L (ref 22–32)
Calcium: 9.1 mg/dL (ref 8.9–10.3)
Chloride: 103 mmol/L (ref 98–111)
Creatinine, Ser: 0.71 mg/dL (ref 0.44–1.00)
GFR, Estimated: >60 mL/min (ref >60)
Glucose, Bld: 103 mg/dL — ABNORMAL HIGH (ref 70–99)
Potassium: 4.3 mmol/L (ref 3.5–5.1)
Sodium: 138 mmol/L (ref 135–145)

## 2023-10-07 LAB — TROPONIN T, HIGH SENSITIVITY
Troponin T High Sensitivity: <15 ng/L (ref 0–19)
Troponin T High Sensitivity: <15 ng/L (ref 0–19)

## 2023-10-07 LAB — MAGNESIUM: Magnesium: 1.8 mg/dL (ref 1.7–2.4)

## 2023-10-07 LAB — PHOSPHORUS: Phosphorus: 3.4 mg/dL (ref 2.5–4.6)

## 2023-10-07 LAB — LIPASE, BLOOD: Lipase: 46 U/L (ref 11–51)

## 2023-10-07 MED ORDER — ONDANSETRON 4 MG PO TBDP
4.0000 mg | ORAL_TABLET | Freq: Once | ORAL | Status: AC
  Administered 2023-10-07: 4 mg via ORAL
  Filled 2023-10-07: qty 1

## 2023-10-07 MED ORDER — SODIUM CHLORIDE 0.45 % IV SOLN: INTRAVENOUS | Status: AC

## 2023-10-07 MED ORDER — ONDANSETRON HCL 4 MG/2ML IJ SOLN
4.0000 mg | Freq: Once | INTRAMUSCULAR | Status: AC
  Administered 2023-10-07: 4 mg via INTRAVENOUS
  Filled 2023-10-07: qty 2

## 2023-10-07 MED ORDER — SODIUM CHLORIDE 0.45 % IV BOLUS
1000.0000 mL | Freq: Once | INTRAVENOUS | Status: AC
  Administered 2023-10-07: 1000 mL via INTRAVENOUS

## 2023-10-07 MED ORDER — MORPHINE SULFATE (PF) 4 MG/ML IV SOLN
4.0000 mg | Freq: Once | INTRAVENOUS | Status: AC
  Administered 2023-10-07: 4 mg via INTRAVENOUS
  Filled 2023-10-07: qty 1

## 2023-10-07 MED ORDER — ONDANSETRON HCL 4 MG PO TABS: 4.0000 mg | ORAL_TABLET | Freq: Four times a day (QID) | ORAL | Status: DC | PRN

## 2023-10-07 MED ORDER — FAMOTIDINE IN NACL 20-0.9 MG/50ML-% IV SOLN
INTRAVENOUS | Status: AC
  Filled 2023-10-07: qty 50

## 2023-10-07 MED ORDER — HYDROMORPHONE HCL 1 MG/ML IJ SOLN
1.0000 mg | Freq: Once | INTRAMUSCULAR | Status: AC
  Administered 2023-10-07: 1 mg via INTRAVENOUS
  Filled 2023-10-07: qty 1

## 2023-10-07 MED ORDER — ENOXAPARIN SODIUM 40 MG/0.4ML IJ SOSY
40.0000 mg | PREFILLED_SYRINGE | INTRAMUSCULAR | Status: DC
  Administered 2023-10-07 – 2023-10-08 (×2): 40 mg via SUBCUTANEOUS
  Filled 2023-10-07 (×2): qty 0.4

## 2023-10-07 MED ORDER — IOHEXOL 300 MG/ML  SOLN
100.0000 mL | Freq: Once | INTRAMUSCULAR | Status: AC | PRN
Start: 2023-10-07 — End: 2023-10-07
  Administered 2023-10-07: 100 mL via INTRAVENOUS

## 2023-10-07 MED ORDER — LABETALOL HCL 5 MG/ML IV SOLN
10.0000 mg | INTRAVENOUS | Status: DC | PRN
Start: 2023-10-07 — End: 2023-10-09
  Administered 2023-10-07: 10 mg via INTRAVENOUS
  Filled 2023-10-07 (×2): qty 4

## 2023-10-07 MED ORDER — IOHEXOL 350 MG/ML SOLN
80.0000 mL | Freq: Once | INTRAVENOUS | Status: AC | PRN
  Administered 2023-10-07: 75 mL via INTRAVENOUS

## 2023-10-07 MED ORDER — AMLODIPINE BESYLATE 5 MG PO TABS
5.0000 mg | ORAL_TABLET | Freq: Once | ORAL | Status: AC
  Administered 2023-10-07: 5 mg via ORAL
  Filled 2023-10-07: qty 1

## 2023-10-07 MED ORDER — PANTOPRAZOLE SODIUM 40 MG IV SOLR
40.0000 mg | Freq: Once | INTRAVENOUS | Status: AC
  Administered 2023-10-07: 40 mg via INTRAVENOUS
  Filled 2023-10-07: qty 10

## 2023-10-07 MED ORDER — AMLODIPINE BESYLATE 5 MG PO TABS
5.0000 mg | ORAL_TABLET | Freq: Every day | ORAL | Status: DC
Start: 2023-10-08 — End: 2023-10-08
  Administered 2023-10-08: 5 mg via ORAL
  Filled 2023-10-07: qty 1

## 2023-10-07 MED ORDER — LABETALOL HCL 5 MG/ML IV SOLN
10.0000 mg | Freq: Once | INTRAVENOUS | Status: AC
  Administered 2023-10-07: 10 mg via INTRAVENOUS
  Filled 2023-10-07: qty 4

## 2023-10-07 MED ORDER — MORPHINE SULFATE (PF) 4 MG/ML IV SOLN
4.0000 mg | INTRAVENOUS | Status: DC | PRN
  Administered 2023-10-08: 4 mg via INTRAVENOUS
  Filled 2023-10-07: qty 1

## 2023-10-07 MED ORDER — FAMOTIDINE IN NACL 20-0.9 MG/50ML-% IV SOLN
20.0000 mg | Freq: Once | INTRAVENOUS | Status: AC
  Administered 2023-10-07: 20 mg via INTRAVENOUS
  Filled 2023-10-07: qty 50

## 2023-10-07 MED ORDER — ONDANSETRON HCL 4 MG/2ML IJ SOLN
4.0000 mg | Freq: Four times a day (QID) | INTRAMUSCULAR | Status: DC | PRN
  Administered 2023-10-07: 4 mg via INTRAVENOUS
  Filled 2023-10-07: qty 2

## 2023-10-07 MED ORDER — ACETAMINOPHEN 650 MG RE SUPP: 650.0000 mg | Freq: Four times a day (QID) | RECTAL | Status: DC | PRN

## 2023-10-07 MED ORDER — METOCLOPRAMIDE HCL 5 MG/ML IJ SOLN
10.0000 mg | Freq: Once | INTRAMUSCULAR | Status: AC
  Administered 2023-10-07: 10 mg via INTRAVENOUS
  Filled 2023-10-07: qty 2

## 2023-10-07 MED ORDER — ACETAMINOPHEN 325 MG PO TABS
650.0000 mg | ORAL_TABLET | Freq: Four times a day (QID) | ORAL | Status: DC | PRN
  Administered 2023-10-07 – 2023-10-08 (×2): 650 mg via ORAL
  Filled 2023-10-07 (×2): qty 2

## 2023-10-07 NOTE — ED Provider Notes (Signed)
 Patient CTA of her abdomen is negative for acute vascular cause but she still has this area of dilated bowel that may be enteritis.  On repeat evaluation patient still having significant abdominal pain and nausea.  Will admit for further care.   Doretha Folks, MD 10/07/23 [phone]

## 2023-10-07 NOTE — ED Triage Notes (Signed)
 Pt states that she has been having generalized abd pain since yesterday, with n/v

## 2023-10-07 NOTE — ED Notes (Signed)
 Writer and another RN attempted to place PIV. All attempts were unsuccessful.

## 2023-10-07 NOTE — ED Provider Notes (Signed)
 [facility] Emergency Department Provider Note MRN:  [id number]  Arrival date & time: 10/07/23     Chief Complaint   Abdominal Pain   History of Present Illness   Kristine Brewer is a 58 y.o. year-old female with a history of hypertension presenting to the ED with chief complaint of abdominal pain.  Epigastric abdominal pain for the past 2 to 3 days.  Started after eating some steak.  Pain not going away, associated with nausea vomiting.  Review of Systems  A thorough review of systems was obtained and all systems are negative except as noted in the HPI and PMH.   Patient's Health History    Past Medical History:  Diagnosis Date   Fibromyalgia    Hypertension    Obesity     Past Surgical History:  Procedure Laterality Date   ABDOMINAL SURGERY     CESAREAN SECTION     UTERINE FIBROID SURGERY      History reviewed. No pertinent family history.  Social History   Socioeconomic History   Marital status: Legally Separated    Spouse name: Not on file   Number of children: Not on file   Years of education: Not on file   Highest education level: Not on file  Occupational History   Not on file  Tobacco Use   Smoking status: Every Day    Current packs/day: 0.50    Average packs/day: 0.5 packs/day for 10.0 years (5.0 ttl pk-yrs)    Types: Cigarettes   Smokeless tobacco: Never  Vaping Use   Vaping status: Never Used  Substance and Sexual Activity   Alcohol use: No   Drug use: No   Sexual activity: Not on file  Other Topics Concern   Not on file  Social History Narrative   Not on file   Social Drivers of Health   Financial Resource Strain: Not on file  Food Insecurity: Not on file  Transportation Needs: Not on file  Physical Activity: Not on file  Stress: Not on file  Social Connections: Not on file  Intimate Partner Violence: Not on file     Physical Exam   Vitals:   10/07/23 0515 10/07/23 0603  BP: (!) 190/91 (!) 115/90   Pulse: 85 80  Resp: 12 14  Temp:    SpO2: 100% 94%    CONSTITUTIONAL: Well-appearing, NAD NEURO/PSYCH:  Alert and oriented x 3, no focal deficits EYES:  eyes equal and reactive ENT/NECK:  no LAD, no JVD CARDIO: Regular rate, well-perfused, normal S1 and S2 PULM:  CTAB no wheezing or rhonchi GI/GU:  non-distended, non-tender MSK/SPINE:  No gross deformities, no edema SKIN:  no rash, atraumatic   *Additional and/or pertinent findings included in MDM below  Diagnostic and Interventional Summary    EKG Interpretation Date/Time:    Ventricular Rate:    PR Interval:    QRS Duration:    QT Interval:    QTC Calculation:   R Axis:      Text Interpretation:         Labs Reviewed  COMPREHENSIVE METABOLIC PANEL WITH GFR - Abnormal; Notable for the following components:      Result Value   Glucose, Bld 100 (*)    Anion gap 16 (*)    All other components within normal limits  URINALYSIS, ROUTINE W REFLEX MICROSCOPIC - Abnormal; Notable for the following components:   Specific Gravity, Urine 1.045 (*)    Ketones, ur 5 (*)  Protein, ur 30 (*)    All other components within normal limits  LIPASE, BLOOD  CBC  TROPONIN T, HIGH SENSITIVITY  TROPONIN T, HIGH SENSITIVITY    CT ABDOMEN PELVIS W CONTRAST  Final Result    CT Angio Chest/Abd/Pel for Dissection W and/or Wo Contrast    (Results Pending)    Medications  ondansetron  (ZOFRAN -ODT) disintegrating tablet 4 mg (4 mg Oral Given 10/07/23 0056)  amLODipine  (NORVASC ) tablet 5 mg (5 mg Oral Given 10/07/23 0404)  ondansetron  (ZOFRAN ) injection 4 mg (4 mg Intravenous Given 10/07/23 0330)  morphine  (PF) 4 MG/ML injection 4 mg (4 mg Intravenous Given 10/07/23 0332)  famotidine  (PEPCID ) IVPB 20 mg premix ( Intravenous Not Given 10/07/23 0411)  iohexol  (OMNIPAQUE ) 300 MG/ML solution 100 mL (100 mLs Intravenous Contrast Given 10/07/23 0342)  labetalol  (NORMODYNE ) injection 10 mg (10 mg Intravenous Given 10/07/23 0520)  HYDROmorphone   (DILAUDID ) injection 1 mg (1 mg Intravenous Given 10/07/23 0536)     Procedures  /  Critical Care Procedures  ED Course and Medical Decision Making  Initial Impression and Ddx Differential diagnosis includes gastritis, gastric outlet obstruction, SBO, pancreatitis, cholecystitis.  Past medical/surgical history that increases complexity of ED encounter: Multiple abdominal surgeries  Interpretation of Diagnostics I personally reviewed the Laboratory Testing and my interpretation is as follows: No significant blood count or electrolyte disturbance.  CT showing small area of dilated loop of bowel, question localized ileus or partial small bowel obstruction per radiology  Patient Reassessment and Ultimate Disposition/Management     Patient was initially in little to no pain but her pain has worsened and she is quite hypertensive despite antihypertensives.  I have concerns that the original CT scan is not adequate to exclude aortic dissection.  Sending back to the scanner.  Signed out to oncoming provider at shift change.  Anticipating need for admission for symptom and blood pressure control.  Patient management required discussion with the following services or consulting groups:  Hospitalist Service  Complexity of Problems Addressed Acute illness or injury that poses threat of life of bodily function  Additional Data Reviewed and Analyzed Further history obtained from: None  Additional Factors Impacting ED Encounter Risk Consideration of hospitalization  Ozell HERO. Theadore, MD [facility] Emergency Medicine [facility] [email or web address]  Final Clinical Impressions(s) / ED Diagnoses     ICD-10-CM   1. Epigastric pain  R10.13       ED Discharge Orders     None        Discharge Instructions Discussed with and Provided to Patient:   Discharge Instructions   None      Theadore Ozell HERO, MD 10/07/23 [phone]

## 2023-10-07 NOTE — H&P (Signed)
 History and Physical    Patient: Kristine Brewer FMW:[id number] DOB: September 13, 1965 DOA: 10/07/2023 DOS: the patient was seen and examined on 10/07/2023 PCP: Claudene Round, MD  Patient coming from: Home  Chief Complaint:  Chief Complaint  Patient presents with   Abdominal Pain   HPI: Kristine Brewer is a 58 y.o. female with medical history significant of fibromyalgia, hypertension, class I obesity, active tobacco use who presented to the emergency department with complaints of abdominal pain since yesterday associated with at least 6 episodes of nausea and emesis.  She denied diarrhea.  Her symptoms started after she ate some steak on Monday.  No sick contacts or travel history.  No diarrhea, constipation, melena or hematochezia.  No flank pain, dysuria, frequency or hematuria.  She denied fever, chills, rhinorrhea, sore throat, wheezing or hemoptysis.  No chest pain, palpitations, diaphoresis, PND, orthopnea or pitting edema of the lower extremities.   No polyuria, polydipsia, polyphagia or blurred vision.   Lab work: Urinalysis show increased specific gravity at 1.045, ketones of 5 and protein of 30 mg/dL, but otherwise unremarkable.  CBC was normal.  CMP showed a glucose of 100 mg/dL and an anion gap of 16, the rest of the CMP measurements were normal.  Normal magnesium, phosphorus, lipase and troponin x 2.  Imaging: CT abdomen/pelvis with contrast showed a loop with dilated small bowel in the upper mid epigastrium status post partial small bowel resection, with smoots transitioning to nondistended small bowel.  CTA chest/abdomen/pelvis intact thoracoabdominal aorta.  No aneurysm or dissection.  No acute PE.  No nonspecific dilatation of a few loops of small bowel in the mid abdomen measuring 3 cm in diameter, questionable enteritis.  Aortic atherosclerosis.   ED course: Initial vital signs were temperature 99.3 F, pulse 67, respiration 19, BP 124/67 mmHg and O2 sat 95% on room air.  She received  amlodipine  5 mg p.o. x 1, famotidine  20 mg IVPB, hydromorphone  1 mg IVP, labetalol  10 mg IVP, metoclopramide  10 mg IVP, morphine  4 mg IVP x 2, ondansetron  4 mg IVP x 1 and ondansetron  4 mg disintegrating tablet x 1.  I added pantoprazole  40 mg IVP and 1000 mL of normal saline bolus.  Review of Systems: As mentioned in the history of present illness. All other systems reviewed and are negative. Past Medical History:  Diagnosis Date   Fibromyalgia    Hypertension    Obesity    Past Surgical History:  Procedure Laterality Date   ABDOMINAL SURGERY     CESAREAN SECTION     UTERINE FIBROID SURGERY     Social History:  reports that she has been smoking cigarettes. She has a 5 pack-year smoking history. She has never used smokeless tobacco. She reports that she does not drink alcohol and does not use drugs.  No Known Allergies  History reviewed. No pertinent family history.  Prior to Admission medications   Medication Sig Start Date End Date Taking? Authorizing Provider  HYDROcodone -acetaminophen  (NORCO/VICODIN) 5-325 MG tablet Take 1 tablet by mouth every 4 (four) hours as needed for moderate pain. 01/03/17   Baxter Drivers, MD  ibuprofen  (ADVIL ,MOTRIN ) 600 MG tablet Take 1 tablet (600 mg total) by mouth every 8 (eight) hours as needed. 01/03/17   Baxter Drivers, MD  levofloxacin  (LEVAQUIN ) 750 MG tablet Take 1 tablet (750 mg total) by mouth daily. 02/11/17   Petrucelli, Lucie SAUNDERS, PA-C    Physical Exam: Vitals:   10/07/23 9554 10/07/23 0500 10/07/23 0515 10/07/23 9396  BP: (!) 195/105 (!) 218/106 (!) 190/91 (!) 115/90  Pulse: 88 79 85 80  Resp: 16 17 12 14   Temp:      TempSrc:      SpO2: 98% 100% 100% 94%  Weight:      Height:       Physical Exam Vitals and nursing note reviewed.  Constitutional:      General: She is awake. She is not in acute distress.    Appearance: She is ill-appearing.  HENT:     Head: Normocephalic.     Nose: No rhinorrhea.     Mouth/Throat:     Mouth:  Mucous membranes are dry.  Eyes:     General: No scleral icterus.    Pupils: Pupils are equal, round, and reactive to light.  Neck:     Vascular: No JVD.  Cardiovascular:     Rate and Rhythm: Normal rate and regular rhythm.     Heart sounds: S1 normal and S2 normal.  Pulmonary:     Effort: Pulmonary effort is normal.     Breath sounds: Normal breath sounds. No wheezing, rhonchi or rales.  Abdominal:     General: Bowel sounds are normal.     Palpations: Abdomen is soft.     Tenderness: There is abdominal tenderness. There is no right CVA tenderness or left CVA tenderness.  Musculoskeletal:     Cervical back: Neck supple.     Right lower leg: No edema.     Left lower leg: No edema.  Skin:    General: Skin is warm and dry.  Neurological:     General: No focal deficit present.     Mental Status: She is alert and oriented to person, place, and time.  Psychiatric:        Mood and Affect: Mood normal.        Behavior: Behavior normal. Behavior is cooperative.     Data Reviewed:  Results are pending, will review when available. EKG: Vent. rate 81 BPM PR interval 219 ms QRS duration 98 ms QT/QTcB 379/440 ms P-R-T axes 72 58 9 Sinus rhythm Prolonged PR interval  Assessment and Plan: Principal Problem:   Intractable abdominal pain Associated with:   Intractable nausea and vomiting Questionable enteritis. Observation/telemetry. Continue IV fluids. Keep n.p.o. for now. Advance diet as tolerated. Analgesics as needed. Antiemetics as needed. Follow CBC, CMP in AM.  Active Problems:   Tobacco abuse Tobacco cessation advised. Nicotine replacement therapy ordered.    Hypertension Presenting with:   Hypertensive urgency Continue amlodipine  5 mg daily. Continue as needed labetalol .    Class 1 obesity Current BMI 34.77 kg/m. Would benefit from lifestyle modifications. Follow-up closely with PCP.    Advance Care Planning:   Code Status: Full Code   Consults:    Family Communication:   Severity of Illness: The appropriate patient status for this patient is OBSERVATION. Observation status is judged to be reasonable and necessary in order to provide the required intensity of service to ensure the patient's safety. The patient's presenting symptoms, physical exam findings, and initial radiographic and laboratory data in the context of their medical condition is felt to place them at decreased risk for further clinical deterioration. Furthermore, it is anticipated that the patient will be medically stable for discharge from the hospital within 2 midnights of admission.   Author: Alm Dorn Castor, MD 10/07/2023 9:47 AM  For on call review [email or web address].   This document was prepared using Conservation officer, historic buildings and  may contain some unintended transcription errors.

## 2023-10-08 ENCOUNTER — Observation Stay (HOSPITAL_COMMUNITY): Payer: Self-pay

## 2023-10-08 LAB — COMPREHENSIVE METABOLIC PANEL WITH GFR
ALT: 9 U/L (ref 0–44)
AST: 15 U/L (ref 15–41)
Albumin: 3.9 g/dL (ref 3.5–5.0)
Alkaline Phosphatase: 101 U/L (ref 38–126)
Anion gap: 13 (ref 5–15)
BUN: 7 mg/dL (ref 6–20)
CO2: 23 mmol/L (ref 22–32)
Calcium: 9.2 mg/dL (ref 8.9–10.3)
Chloride: 101 mmol/L (ref 98–111)
Creatinine, Ser: 0.73 mg/dL (ref 0.44–1.00)
GFR, Estimated: >60 mL/min (ref >60)
Glucose, Bld: 93 mg/dL (ref 70–99)
Potassium: 3.5 mmol/L (ref 3.5–5.1)
Sodium: 137 mmol/L (ref 135–145)
Total Bilirubin: 0.5 mg/dL (ref 0.0–1.2)
Total Protein: 6.7 g/dL (ref 6.5–8.1)

## 2023-10-08 LAB — CBC
HCT: 38.7 % (ref 36.0–46.0)
Hemoglobin: 12.2 g/dL (ref 12.0–15.0)
MCH: 26.4 pg (ref 26.0–34.0)
MCHC: 31.5 g/dL (ref 30.0–36.0)
MCV: 83.8 fL (ref 80.0–100.0)
Platelets: 159 K/uL (ref 150–400)
RBC: 4.62 MIL/uL (ref 3.87–5.11)
RDW: 14.7 % (ref 11.5–15.5)
WBC: 6.3 K/uL (ref 4.0–10.5)
nRBC: 0 % (ref 0.0–0.2)

## 2023-10-08 LAB — HIV ANTIBODY (ROUTINE TESTING W REFLEX): HIV Screen 4th Generation wRfx: NONREACTIVE

## 2023-10-08 MED ORDER — AMLODIPINE BESYLATE 10 MG PO TABS
10.0000 mg | ORAL_TABLET | Freq: Every day | ORAL | Status: DC
  Administered 2023-10-09: 10 mg via ORAL
  Filled 2023-10-08: qty 1

## 2023-10-08 MED ORDER — POLYETHYLENE GLYCOL 3350 17 G PO PACK
17.0000 g | PACK | Freq: Every day | ORAL | Status: DC | PRN
  Administered 2023-10-08: 17 g via ORAL
  Filled 2023-10-08: qty 1

## 2023-10-08 MED ORDER — AMLODIPINE BESYLATE 5 MG PO TABS: 5.0000 mg | ORAL_TABLET | Freq: Once | ORAL | Status: DC

## 2023-10-08 NOTE — Progress Notes (Addendum)
 PROGRESS NOTE  Kristine Brewer  FMW:[id number] DOB: 05-Feb-1965 DOA: 10/07/2023 PCP: Claudene Round, MD   Brief Narrative: Patient is a 58 year old female with history of fibromyalgia, hypertension, class I obesity, active tobacco use who presented with complaint of abdominal pain, nausea, vomiting but no diarrhea.  Report of eating a steak on Monday .  On presentation, she was mildly hypertensive but afebrile with no leukocytosis.  CT abdomen/pelvis showed loop of dilated small bowel in the upper mid epigastrium , questionable enteritis.  Patient admitted for the management of acute enteritis, suspected SBO.  She has clinically improved.  Passing gas but no bowel movement.  Abdominal x-ray this morning did not show any dilated bowels.  Started on full liquid diet.  Assessment & Plan:  Principal Problem:   Intractable nausea and vomiting Active Problems:   Tobacco abuse   Hypertension   Class 1 obesity   Intractable abdominal pain   Hypertensive urgency   Abdominal pain/nausea/vomiting.:  CT imaging as above.  Abdomen is soft, nondistended and mostly nontender.  She complains of vague abdominal discomfort.  No nausea or vomiting today.  Passing gas.  No bowel movement yet. Follow-up abdominal x-ray today did not show any dilated bowels.  Started on full liquid.  Will gradually advance as tolerated. She has history of surgery of abdominal for uterine fibroid  Tobacco abuse: Cessation advised  Hypertension: On amlodipine  5 mg daily.  May need to go up on the dose  Obesity: BMI 34.7        DVT prophylaxis:enoxaparin  (LOVENOX ) injection 40 mg Start: 10/07/23 2200 SCDs Start: 10/07/23 0953     Code Status: Full Code  Family Communication: None at bedside  Patient status:Inpatient  Patient is from :Home  Anticipated discharge un:Ynfz  Estimated DC date:tomorrow   Consultants: None  Procedures:None  Antimicrobials:  Anti-infectives (From admission, onward)    None        Subjective: Patient seen and examined at the bedside today.  Hemodynamically stable.  Appears comfortable.    No nausea or vomiting.  Passing gas but no bowel movement.  Complains of some vague abdominal discomfort but definitely feels better today  Objective: Vitals:   10/07/23 1556 10/07/23 2009 10/07/23 2200 10/08/23 0410  BP: (!) 189/94 (!) 192/95 (!) 141/79 (!) 155/75  Pulse: 87 93 79 75  Resp: 18 18  18   Temp: 98.6 F (37 C) 99 F (37.2 C)  98.5 F (36.9 C)  TempSrc: Oral     SpO2: 96% 100%  96%  Weight:      Height:        Intake/Output Summary (Last 24 hours) at 10/08/2023 0816 Last data filed at 10/08/2023 0319 Gross per 24 hour  Intake 1324.41 ml  Output --  Net 1324.41 ml   Filed Weights   10/07/23 0017  Weight: 83.5 kg    Examination:  General exam: Overall comfortable, not in distress, obese HEENT: PERRL Respiratory system:  no wheezes or crackles  Cardiovascular system: S1 & S2 heard, RRR.  Gastrointestinal system: Abdomen is mildly distended, soft and nontender.  Bowel sounds present Central nervous system: Alert and oriented Extremities: No edema, no clubbing ,no cyanosis Skin: No rashes, no ulcers,no icterus     Data Reviewed: I have personally reviewed following labs and imaging studies  CBC: Recent Labs  Lab 10/07/23 0032 10/08/23 0559  WBC 6.2 6.3  HGB 12.6 12.2  HCT 39.7 38.7  MCV 83.4 83.8  PLT 179 159   Basic  Metabolic Panel: Recent Labs  Lab 10/07/23 0032 10/07/23 1035 10/08/23 0559  NA 140 138 137  K 3.7 4.3 3.5  CL 103 103 101  CO2 22 22 23   GLUCOSE 100* 103* 93  BUN 8 7 7   CREATININE 0.77 0.71 0.73  CALCIUM 10.1 9.1 9.2  MG  --  1.8  --   PHOS  --  3.4  --      No results found for this or any previous visit (from the past 240 hours).   Radiology Studies: CT Angio Chest/Abd/Pel for Dissection W and/or Wo Contrast Result Date: 10/07/2023 CLINICAL DATA:  Acute aortic syndrome (AAS) suspected, acute chest and  abdominal pain EXAM: CT ANGIOGRAPHY CHEST, ABDOMEN AND PELVIS TECHNIQUE: Non-contrast CT of the chest was initially obtained. Multidetector CT imaging through the chest, abdomen and pelvis was performed using the standard protocol during bolus administration of intravenous contrast. Multiplanar reconstructed images and MIPs were obtained and reviewed to evaluate the vascular anatomy. RADIATION DOSE REDUCTION: This exam was performed according to the departmental dose-optimization program which includes automated exposure control, adjustment of the mA and/or kV according to patient size and/or use of iterative reconstruction technique. CONTRAST:  75mL OMNIPAQUE  IOHEXOL  350 MG/ML SOLN COMPARISON:  10/07/2023 FINDINGS: CTA CHEST FINDINGS Cardiovascular: Preferential opacification of the thoracic aorta. No evidence of thoracic aortic aneurysm or dissection. Normal heart size. No pericardial effusion. Opacified pulmonary arteries also appear patent. No significant filling defect or acute pulmonary embolus identified. Central venous structures appear patent. No veno-occlusive finding. Mediastinum/Nodes: Left mid thyroid hypodense nodule measures 1.6 cm, image 9/11. Trachea central airways are patent. Esophagus nondilated. Small hiatal hernia noted. No adenopathy. Lungs/Pleura: There are very mild patchy scattered ground-glass opacities throughout both lungs, favored to be scattered hypoventilatory changes. No inter lobular septal thickening. No focal airspace opacity, severe pneumonia, collapse or consolidation. Trachea central airways are patent. Minor basilar atelectasis. No pleural abnormality, effusion, or pneumothorax. Musculoskeletal: No chest wall abnormality. No acute or significant osseous findings. Review of the MIP images confirms the above findings. CTA ABDOMEN AND PELVIS FINDINGS VASCULAR Aorta: Normal caliber aorta without aneurysm, dissection, vasculitis or significant stenosis. Minor aortic  atherosclerosis. Celiac: Patent without evidence of aneurysm, dissection, vasculitis or significant stenosis. SMA: Patent without evidence of aneurysm, dissection, vasculitis or significant stenosis. Renals: Both renal arteries are patent without evidence of aneurysm, dissection, vasculitis, fibromuscular dysplasia or significant stenosis. Accessory right renal artery also noted to the mid pole region. IMA: Remains patent off the distal aorta including its branches. Limited opacification into the pelvis. Inflow: Patent without evidence of aneurysm, dissection, vasculitis or significant stenosis. Minor iliac atherosclerosis and slight tortuosity. Veins: Dedicated venous phase imaging not performed. Review of the MIP images confirms the above findings. NON-VASCULAR Hepatobiliary: No focal liver abnormality is seen. No gallstones, gallbladder wall thickening, or biliary dilatation. Pancreas: Unremarkable. No pancreatic ductal dilatation or surrounding inflammatory changes. Spleen: Normal in size without focal abnormality. Adrenals/Urinary Tract: Adrenal glands are unremarkable. Kidneys are normal, without renal calculi, focal lesion, or hydronephrosis. Bladder is unremarkable. Stomach/Bowel: Negative for bowel obstruction ileus, or free air. Similar nonspecific dilatation a few loops of small bowel in the mid abdomen measuring 3 cm in diameter, nonspecific. Remote changes of previous partial small-bowel resection. Dilatation of the small bowel may be related to chronic anastomotic stricture. Difficult to exclude nonspecific enteritis. Appendix not visualized. No acute inflammatory process in the right lower quadrant. No significant fluid collection, hemorrhage, hematoma, abscess or ascites. No free air. Lymphatic:  No bulky adenopathy. Reproductive: Degenerated calcified fibroids again noted. No adnexal abnormality. Trace pelvic free fluid favored to be physiologic. Other: No abdominal wall hernia or abnormality. No  abdominopelvic ascites. Musculoskeletal: No acute or significant osseous findings. Review of the MIP images confirms the above findings. IMPRESSION: 1. Intact thoracoabdominal aorta. Negative for aneurysm or dissection. No acute aortic process identified. 2. No acute pulmonary embolus. 3. No other acute intrathoracic finding. 4. 1.6 cm left mid thyroid nodule. Recommend nonemergent thyroid US  (ref: J Am Coll Radiol. 2015 Feb;12(2): 143-50). 5. Similar nonspecific dilatation of a few loops of small bowel in the mid abdomen measuring 3 cm in diameter, nonspecific. Difficult to exclude enteritis. 6. Aortic atherosclerosis. Aortic Atherosclerosis (ICD10-I70.0). Electronically Signed   By: CHRISTELLA.  Shick M.D.   On: 10/07/2023 09:03   CT ABDOMEN PELVIS W CONTRAST Result Date: 10/07/2023 EXAM: CT ABDOMEN AND PELVIS WITH CONTRAST 10/07/2023 04:05:53 AM TECHNIQUE: CT of the abdomen and pelvis was performed with the administration of intravenous contrast. Multiplanar reformatted images are provided for review. Automated exposure control, iterative reconstruction, and/or weight-based adjustment of the mA/kV was utilized to reduce the radiation dose to as low as reasonably achievable. COMPARISON: MRI of the pelvis dated 07/27/2010. CLINICAL HISTORY: Abdominal pain, acute, nonlocalized. Pt states that she has been having generalized abd pain since yesterday, with n/v. FINDINGS: LOWER CHEST: Mild streaky atelectasis in the base of the right lower lobe. LIVER: The liver is unremarkable. GALLBLADDER AND BILE DUCTS: Gallbladder is unremarkable. No biliary ductal dilatation. SPLEEN: No acute abnormality. PANCREAS: No acute abnormality. ADRENAL GLANDS: No acute abnormality. KIDNEYS, URETERS AND BLADDER: No stones in the kidneys or ureters. No hydronephrosis. No perinephric or periureteral stranding. Urinary bladder is unremarkable. GI AND BOWEL: Small sliding hiatus hernia. The patient is status post partial small bowel resection. There  is a loop of dilated small bowel in the upper mid epigastrium which is distended with fluid and measures approximately 3 cm in diameter. There is smooth transitioning with nondistended small bowel. The appendix is not identified. PERITONEUM AND RETROPERITONEUM: No ascites. No free air. VASCULATURE: Aorta is normal in caliber. LYMPH NODES: There is a cluster of mildly prominent right iliac lymph nodes seen on image 46 of series 2. REPRODUCTIVE ORGANS: There are multiple uterine fibroids again demonstrated including a calcified exophytic uterine fibroid on the left measuring approximately 3.3 cm in diameter. BONES AND SOFT TISSUES: No acute osseous abnormality. No focal soft tissue abnormality. IMPRESSION: 1. Loop of dilated small bowel in the upper mid epigastrium, status post partial small bowel resection, with smooth transitioning to nondistended small bowel. Electronically signed by: Evalene Coho MD 10/07/2023 05:02 AM EDT RP Workstation: [id number]    Scheduled Meds:  amLODipine   5 mg Oral Daily   enoxaparin  (LOVENOX ) injection  40 mg Subcutaneous Q24H   Continuous Infusions:  sodium chloride  125 mL/hr at 10/08/23 0319     LOS: 0 days   Ivonne Mustache, MD [facility] P9/18/2025, 8:16 AM

## 2023-10-08 NOTE — Plan of Care (Signed)

## 2023-10-08 NOTE — Progress Notes (Signed)
   10/08/23 1533  TOC Brief Assessment  Insurance and Status Lapsed  Patient has primary care physician Yes Beverlee, Arnulfo, MD)  Home environment has been reviewed Home  Prior level of function: Independent  Prior/Current Home Services No current home services  Social Drivers of Health Review SDOH reviewed no interventions necessary  Readmission risk has been reviewed Yes  Transition of care needs no transition of care needs at this time

## 2023-10-08 NOTE — Plan of Care (Signed)

## 2023-10-09 ENCOUNTER — Other Ambulatory Visit (HOSPITAL_COMMUNITY): Payer: Self-pay

## 2023-10-09 MED ORDER — AMLODIPINE BESYLATE 10 MG PO TABS
10.0000 mg | ORAL_TABLET | Freq: Every day | ORAL | 0 refills | Status: AC
  Filled 2023-10-09: qty 30, 30d supply, fill #0

## 2023-10-09 MED ORDER — POLYETHYLENE GLYCOL 3350 17 GM/SCOOP PO POWD
17.0000 g | Freq: Every day | ORAL | 0 refills | Status: AC | PRN
  Filled 2023-10-09: qty 238, 14d supply, fill #0

## 2023-10-09 NOTE — Plan of Care (Signed)

## 2023-10-09 NOTE — Discharge Summary (Signed)
 Physician Discharge Summary  Kristine Brewer FMW:[id number] DOB: 07-Oct-1965 DOA: 10/07/2023  PCP: Claudene Round, MD  Admit date: 10/07/2023 Discharge date: 10/09/2023  Admitted From: Home Disposition:  Home  Discharge Condition:Stable CODE STATUS:FULL Diet recommendation: Heart Healthy   Brief/Interim Summary: Patient is a 58 year old female with history of fibromyalgia, hypertension, class I obesity, active tobacco use who presented with complaint of abdominal pain, nausea, vomiting but no diarrhea.  Report of eating a steak on Monday .  On presentation, she was mildly hypertensive but afebrile with no leukocytosis.  CT abdomen/pelvis showed loop of dilated small bowel in the upper mid epigastrium , questionable enteritis.  Patient admitted for the management of acute enteritis, suspected SBO.  She has clinically improved.    Abdominal x-ray this morning did not show any dilated bowels.  Started on soft diet, tolerating well.  No bowel movement but she is passing gas.  Abdomen is soft and nontender with good bowel sounds.  Medically stable for discharge home today  Following problems were addressed during the hospitalization:  Abdominal pain/nausea/vomiting/concern for SBO:  CT imaging as above.   She has clinically improved.    Abdominal x-ray this morning did not show any dilated bowels.  Started on soft diet, tolerating well.  No bowel movement but she is passing gas.  Abdomen is soft and nontender with good bowel sounds. She has history of surgery of abdominal for uterine fibroid   Tobacco abuse: Cessation advised   Hypertension: On amlodipine  5 mg daily.  Increased to 10 mg daily   Obesity: BMI 34.7       Discharge Diagnoses:  Principal Problem:   Intractable nausea and vomiting Active Problems:   Tobacco abuse   Hypertension   Class 1 obesity   Intractable abdominal pain   Hypertensive urgency    Discharge Instructions  Discharge Instructions     Diet - low sodium  heart healthy   Complete by: As directed    Discharge instructions   Complete by: As directed    1)Please take your medications as instructed 2)Monitor your blood pressure at home 3)Follow up with your PCP next week   Increase activity slowly   Complete by: As directed       Allergies as of 10/09/2023   No Known Allergies      Medication List     TAKE these medications    amLODipine  10 MG tablet Commonly known as: NORVASC  Take 1 tablet (10 mg total) by mouth daily. Start taking on: October 10, 2023 What changed:  medication strength how much to take   polyethylene glycol 17 g packet Commonly known as: MIRALAX  / GLYCOLAX  Take 17 g by mouth daily as needed for mild constipation.        No Known Allergies  Consultations: None   Procedures/Studies: DG Abd Portable 1V Result Date: 10/08/2023 CLINICAL DATA:  Small bowel obstruction. EXAM: PORTABLE ABDOMEN - 1 VIEW COMPARISON:  October 07, 2023. FINDINGS: No abnormal bowel dilatation is noted. Calcified degenerating fibroid is noted in pelvis. IMPRESSION: No abnormal bowel dilatation. Electronically Signed   By: Lynwood Landy Raddle M.D.   On: 10/08/2023 10:24   CT Angio Chest/Abd/Pel for Dissection W and/or Wo Contrast Result Date: 10/07/2023 CLINICAL DATA:  Acute aortic syndrome (AAS) suspected, acute chest and abdominal pain EXAM: CT ANGIOGRAPHY CHEST, ABDOMEN AND PELVIS TECHNIQUE: Non-contrast CT of the chest was initially obtained. Multidetector CT imaging through the chest, abdomen and pelvis was performed using the standard protocol during  bolus administration of intravenous contrast. Multiplanar reconstructed images and MIPs were obtained and reviewed to evaluate the vascular anatomy. RADIATION DOSE REDUCTION: This exam was performed according to the departmental dose-optimization program which includes automated exposure control, adjustment of the mA and/or kV according to patient size and/or use of iterative  reconstruction technique. CONTRAST:  75mL OMNIPAQUE  IOHEXOL  350 MG/ML SOLN COMPARISON:  10/07/2023 FINDINGS: CTA CHEST FINDINGS Cardiovascular: Preferential opacification of the thoracic aorta. No evidence of thoracic aortic aneurysm or dissection. Normal heart size. No pericardial effusion. Opacified pulmonary arteries also appear patent. No significant filling defect or acute pulmonary embolus identified. Central venous structures appear patent. No veno-occlusive finding. Mediastinum/Nodes: Left mid thyroid hypodense nodule measures 1.6 cm, image 9/11. Trachea central airways are patent. Esophagus nondilated. Small hiatal hernia noted. No adenopathy. Lungs/Pleura: There are very mild patchy scattered ground-glass opacities throughout both lungs, favored to be scattered hypoventilatory changes. No inter lobular septal thickening. No focal airspace opacity, severe pneumonia, collapse or consolidation. Trachea central airways are patent. Minor basilar atelectasis. No pleural abnormality, effusion, or pneumothorax. Musculoskeletal: No chest wall abnormality. No acute or significant osseous findings. Review of the MIP images confirms the above findings. CTA ABDOMEN AND PELVIS FINDINGS VASCULAR Aorta: Normal caliber aorta without aneurysm, dissection, vasculitis or significant stenosis. Minor aortic atherosclerosis. Celiac: Patent without evidence of aneurysm, dissection, vasculitis or significant stenosis. SMA: Patent without evidence of aneurysm, dissection, vasculitis or significant stenosis. Renals: Both renal arteries are patent without evidence of aneurysm, dissection, vasculitis, fibromuscular dysplasia or significant stenosis. Accessory right renal artery also noted to the mid pole region. IMA: Remains patent off the distal aorta including its branches. Limited opacification into the pelvis. Inflow: Patent without evidence of aneurysm, dissection, vasculitis or significant stenosis. Minor iliac atherosclerosis  and slight tortuosity. Veins: Dedicated venous phase imaging not performed. Review of the MIP images confirms the above findings. NON-VASCULAR Hepatobiliary: No focal liver abnormality is seen. No gallstones, gallbladder wall thickening, or biliary dilatation. Pancreas: Unremarkable. No pancreatic ductal dilatation or surrounding inflammatory changes. Spleen: Normal in size without focal abnormality. Adrenals/Urinary Tract: Adrenal glands are unremarkable. Kidneys are normal, without renal calculi, focal lesion, or hydronephrosis. Bladder is unremarkable. Stomach/Bowel: Negative for bowel obstruction ileus, or free air. Similar nonspecific dilatation a few loops of small bowel in the mid abdomen measuring 3 cm in diameter, nonspecific. Remote changes of previous partial small-bowel resection. Dilatation of the small bowel may be related to chronic anastomotic stricture. Difficult to exclude nonspecific enteritis. Appendix not visualized. No acute inflammatory process in the right lower quadrant. No significant fluid collection, hemorrhage, hematoma, abscess or ascites. No free air. Lymphatic: No bulky adenopathy. Reproductive: Degenerated calcified fibroids again noted. No adnexal abnormality. Trace pelvic free fluid favored to be physiologic. Other: No abdominal wall hernia or abnormality. No abdominopelvic ascites. Musculoskeletal: No acute or significant osseous findings. Review of the MIP images confirms the above findings. IMPRESSION: 1. Intact thoracoabdominal aorta. Negative for aneurysm or dissection. No acute aortic process identified. 2. No acute pulmonary embolus. 3. No other acute intrathoracic finding. 4. 1.6 cm left mid thyroid nodule. Recommend nonemergent thyroid US  (ref: J Am Coll Radiol. 2015 Feb;12(2): 143-50). 5. Similar nonspecific dilatation of a few loops of small bowel in the mid abdomen measuring 3 cm in diameter, nonspecific. Difficult to exclude enteritis. 6. Aortic atherosclerosis. Aortic  Atherosclerosis (ICD10-I70.0). Electronically Signed   By: CHRISTELLA.  Shick M.D.   On: 10/07/2023 09:03   CT ABDOMEN PELVIS W CONTRAST Result Date: 10/07/2023 EXAM: CT  ABDOMEN AND PELVIS WITH CONTRAST 10/07/2023 04:05:53 AM TECHNIQUE: CT of the abdomen and pelvis was performed with the administration of intravenous contrast. Multiplanar reformatted images are provided for review. Automated exposure control, iterative reconstruction, and/or weight-based adjustment of the mA/kV was utilized to reduce the radiation dose to as low as reasonably achievable. COMPARISON: MRI of the pelvis dated 07/27/2010. CLINICAL HISTORY: Abdominal pain, acute, nonlocalized. Pt states that she has been having generalized abd pain since yesterday, with n/v. FINDINGS: LOWER CHEST: Mild streaky atelectasis in the base of the right lower lobe. LIVER: The liver is unremarkable. GALLBLADDER AND BILE DUCTS: Gallbladder is unremarkable. No biliary ductal dilatation. SPLEEN: No acute abnormality. PANCREAS: No acute abnormality. ADRENAL GLANDS: No acute abnormality. KIDNEYS, URETERS AND BLADDER: No stones in the kidneys or ureters. No hydronephrosis. No perinephric or periureteral stranding. Urinary bladder is unremarkable. GI AND BOWEL: Small sliding hiatus hernia. The patient is status post partial small bowel resection. There is a loop of dilated small bowel in the upper mid epigastrium which is distended with fluid and measures approximately 3 cm in diameter. There is smooth transitioning with nondistended small bowel. The appendix is not identified. PERITONEUM AND RETROPERITONEUM: No ascites. No free air. VASCULATURE: Aorta is normal in caliber. LYMPH NODES: There is a cluster of mildly prominent right iliac lymph nodes seen on image 46 of series 2. REPRODUCTIVE ORGANS: There are multiple uterine fibroids again demonstrated including a calcified exophytic uterine fibroid on the left measuring approximately 3.3 cm in diameter. BONES AND SOFT  TISSUES: No acute osseous abnormality. No focal soft tissue abnormality. IMPRESSION: 1. Loop of dilated small bowel in the upper mid epigastrium, status post partial small bowel resection, with smooth transitioning to nondistended small bowel. Electronically signed by: Evalene Coho MD 10/07/2023 05:02 AM EDT RP Workstation: [id number]      Subjective: Patient seen and examined at bedside today.  Hemodynamically stable.  Comfortable.  Passing gas.  Abdomen is soft and nondistended.  Tolerating soft diet today.  Medically stable for discharge  Discharge Exam: Vitals:   10/08/23 2109 10/09/23 0549  BP: (!) 150/80 (!) 153/87  Pulse: 79 68  Resp: 16 16  Temp: 99.2 F (37.3 C) 98.4 F (36.9 C)  SpO2: 97% 97%   Vitals:   10/08/23 0410 10/08/23 1355 10/08/23 2109 10/09/23 0549  BP: (!) 155/75 (!) 156/83 (!) 150/80 (!) 153/87  Pulse: 75 75 79 68  Resp: 18 18 16 16   Temp: 98.5 F (36.9 C) 98.4 F (36.9 C) 99.2 F (37.3 C) 98.4 F (36.9 C)  TempSrc:  Oral Oral Oral  SpO2: 96% 96% 97% 97%  Weight:      Height:        General: Pt is alert, awake, not in acute distress Cardiovascular: RRR, S1/S2 +, no rubs, no gallops Respiratory: CTA bilaterally, no wheezing, no rhonchi Abdominal: Soft, NT, ND, bowel sounds + Extremities: no edema, no cyanosis    The results of significant diagnostics from this hospitalization (including imaging, microbiology, ancillary and laboratory) are listed below for reference.     Microbiology: No results found for this or any previous visit (from the past 240 hours).   Labs: BNP (last 3 results) No results for input(s): BNP in the last 8760 hours. Basic Metabolic Panel: Recent Labs  Lab 10/07/23 0032 10/07/23 1035 10/08/23 0559  NA 140 138 137  K 3.7 4.3 3.5  CL 103 103 101  CO2 22 22 23   GLUCOSE 100* 103* 93  BUN 8  7 7  CREATININE 0.77 0.71 0.73  CALCIUM 10.1 9.1 9.2  MG  --  1.8  --   PHOS  --  3.4  --    Liver Function  Tests: Recent Labs  Lab 10/07/23 0032 10/08/23 0559  AST 18 15  ALT 10 9  ALKPHOS 119 101  BILITOT 0.3 0.5  PROT 7.9 6.7  ALBUMIN 4.6 3.9   Recent Labs  Lab 10/07/23 0032  LIPASE 46   No results for input(s): AMMONIA in the last 168 hours. CBC: Recent Labs  Lab 10/07/23 0032 10/08/23 0559  WBC 6.2 6.3  HGB 12.6 12.2  HCT 39.7 38.7  MCV 83.4 83.8  PLT 179 159   Cardiac Enzymes: No results for input(s): CKTOTAL, CKMB, CKMBINDEX, TROPONINI in the last 168 hours. BNP: Invalid input(s): POCBNP CBG: No results for input(s): GLUCAP in the last 168 hours. D-Dimer No results for input(s): DDIMER in the last 72 hours. Hgb A1c No results for input(s): HGBA1C in the last 72 hours. Lipid Profile No results for input(s): CHOL, HDL, LDLCALC, TRIG, CHOLHDL, LDLDIRECT in the last 72 hours. Thyroid function studies No results for input(s): TSH, T4TOTAL, T3FREE, THYROIDAB in the last 72 hours.  Invalid input(s): FREET3 Anemia work up No results for input(s): VITAMINB12, FOLATE, FERRITIN, TIBC, IRON, RETICCTPCT in the last 72 hours. Urinalysis    Component Value Date/Time   COLORURINE YELLOW 10/07/2023 0535   APPEARANCEUR CLEAR 10/07/2023 0535   LABSPEC 1.045 (H) 10/07/2023 0535   PHURINE 7.0 10/07/2023 0535   GLUCOSEU NEGATIVE 10/07/2023 0535   HGBUR NEGATIVE 10/07/2023 0535   BILIRUBINUR NEGATIVE 10/07/2023 0535   KETONESUR 5 (A) 10/07/2023 0535   PROTEINUR 30 (A) 10/07/2023 0535   UROBILINOGEN 0.2 11/15/2012 1347   NITRITE NEGATIVE 10/07/2023 0535   LEUKOCYTESUR NEGATIVE 10/07/2023 0535   Sepsis Labs Recent Labs  Lab 10/07/23 0032 10/08/23 0559  WBC 6.2 6.3   Microbiology No results found for this or any previous visit (from the past 240 hours).  Please note: You were cared for by a hospitalist during your hospital stay. Once you are discharged, your primary care physician will handle any further medical  issues. Please note that NO REFILLS for any discharge medications will be authorized once you are discharged, as it is imperative that you return to your primary care physician (or establish a relationship with a primary care physician if you do not have one) for your post hospital discharge needs so that they can reassess your need for medications and monitor your lab values.    Time coordinating discharge: 40 minutes  SIGNED:   Ivonne Mustache, MD  [facility] 10/09/2023, 10:39 AM Pager [phone]  If 7PM-7AM, please contact night-coverage [email or web address] Password [email or web address]
